# Patient Record
Sex: Female | Born: 1935 | Race: White | Hispanic: No | Marital: Married | Smoking: Never smoker
Health system: Southern US, Community
[De-identification: ages and names within clinical notes are randomized; demographics above are authoritative.]

## PROBLEM LIST (undated history)

## (undated) DIAGNOSIS — E079 Disorder of thyroid, unspecified: Secondary | ICD-10-CM

## (undated) DIAGNOSIS — I10 Essential (primary) hypertension: Secondary | ICD-10-CM

## (undated) DIAGNOSIS — K219 Gastro-esophageal reflux disease without esophagitis: Secondary | ICD-10-CM

## (undated) DIAGNOSIS — F32A Depression, unspecified: Secondary | ICD-10-CM

## (undated) DIAGNOSIS — M1990 Unspecified osteoarthritis, unspecified site: Secondary | ICD-10-CM

## (undated) DIAGNOSIS — E039 Hypothyroidism, unspecified: Secondary | ICD-10-CM

## (undated) DIAGNOSIS — I2699 Other pulmonary embolism without acute cor pulmonale: Secondary | ICD-10-CM

## (undated) DIAGNOSIS — R7303 Prediabetes: Secondary | ICD-10-CM

## (undated) DIAGNOSIS — F419 Anxiety disorder, unspecified: Secondary | ICD-10-CM

## (undated) DIAGNOSIS — H9191 Unspecified hearing loss, right ear: Secondary | ICD-10-CM

## (undated) HISTORY — PX: FOOT NEUROMA SURGERY: SHX646

## (undated) HISTORY — PX: APPENDECTOMY: SHX54

## (undated) HISTORY — PX: SQUAMOUS CELL CARCINOMA EXCISION: SHX2433

## (undated) HISTORY — PX: REPLACEMENT TOTAL KNEE: SUR1224

## (undated) HISTORY — DX: Gastro-esophageal reflux disease without esophagitis: K21.9

## (undated) HISTORY — PX: FOOT SURGERY: SHX648

## (undated) HISTORY — DX: Other pulmonary embolism without acute cor pulmonale: I26.99

## (undated) HISTORY — PX: CATARACT EXTRACTION: SUR2

---

## 1980-07-21 HISTORY — PX: FOOT NEUROMA SURGERY: SHX646

## 2001-07-21 HISTORY — PX: ROTATOR CUFF REPAIR: SHX139

## 2009-07-21 HISTORY — PX: IVC FILTER INSERTION: CATH118245

## 2009-07-21 HISTORY — PX: KNEE ARTHROSCOPY: SUR90

## 2010-09-18 ENCOUNTER — Other Ambulatory Visit (HOSPITAL_COMMUNITY): Payer: Self-pay | Admitting: Orthopaedic Surgery

## 2010-09-18 ENCOUNTER — Encounter (HOSPITAL_COMMUNITY)
Admission: RE | Admit: 2010-09-18 | Discharge: 2010-09-18 | Disposition: A | Discharge status: Home / Self Care | Payer: Medicare Other | Source: Ambulatory Visit | Attending: Orthopaedic Surgery | Admitting: Orthopaedic Surgery

## 2010-09-18 ENCOUNTER — Ambulatory Visit (HOSPITAL_COMMUNITY)
Admission: RE | Admit: 2010-09-18 | Discharge: 2010-09-18 | Disposition: A | Discharge status: Home / Self Care | Payer: Medicare Other | Source: Ambulatory Visit | Attending: Orthopaedic Surgery | Admitting: Orthopaedic Surgery

## 2010-09-18 DIAGNOSIS — Z01812 Encounter for preprocedural laboratory examination: Secondary | ICD-10-CM | POA: Insufficient documentation

## 2010-09-18 DIAGNOSIS — Z01818 Encounter for other preprocedural examination: Secondary | ICD-10-CM | POA: Insufficient documentation

## 2010-09-18 DIAGNOSIS — I10 Essential (primary) hypertension: Secondary | ICD-10-CM | POA: Insufficient documentation

## 2010-09-18 DIAGNOSIS — M1710 Unilateral primary osteoarthritis, unspecified knee: Secondary | ICD-10-CM

## 2010-09-18 DIAGNOSIS — IMO0002 Reserved for concepts with insufficient information to code with codable children: Secondary | ICD-10-CM | POA: Insufficient documentation

## 2010-09-18 LAB — APTT: aPTT: 28 seconds (ref 24–37)

## 2010-09-18 LAB — SURGICAL PCR SCREEN: MRSA, PCR: NEGATIVE

## 2010-09-18 LAB — PROTIME-INR
INR: 0.98 (ref 0.00–1.49)
Prothrombin Time: 13.2 seconds (ref 11.6–15.2)

## 2010-09-18 LAB — COMPREHENSIVE METABOLIC PANEL
AST: 35 U/L (ref 0–37)
Albumin: 4.3 g/dL (ref 3.5–5.2)
BUN: 16 mg/dL (ref 6–23)
Chloride: 100 mEq/L (ref 96–112)
Creatinine, Ser: 0.72 mg/dL (ref 0.4–1.2)
GFR calc Af Amer: >60 mL/min (ref >60)
Total Bilirubin: 0.4 mg/dL (ref 0.3–1.2)
Total Protein: 7.4 g/dL (ref 6.0–8.3)

## 2010-09-18 LAB — CBC
HCT: 36.5 % (ref 36.0–46.0)
Platelets: 335 10*3/uL (ref 150–400)
RDW: 13 % (ref 11.5–15.5)
WBC: 11.9 10*3/uL — ABNORMAL HIGH (ref 4.0–10.5)

## 2010-09-18 LAB — URINE MICROSCOPIC-ADD ON

## 2010-09-18 LAB — ABO/RH: ABO/RH(D): A POS

## 2010-09-18 LAB — DIFFERENTIAL
Basophils Absolute: 0 10*3/uL (ref 0.0–0.1)
Eosinophils Relative: 1 % (ref 0–5)
Lymphocytes Relative: 33 % (ref 12–46)

## 2010-09-18 LAB — URINALYSIS, ROUTINE W REFLEX MICROSCOPIC
Nitrite: NEGATIVE
Specific Gravity, Urine: 1.006 (ref 1.005–1.030)
Urobilinogen, UA: 0.2 mg/dL (ref 0.0–1.0)

## 2010-09-19 LAB — URINE CULTURE: Culture  Setup Time: 201202291920

## 2010-09-24 ENCOUNTER — Inpatient Hospital Stay (HOSPITAL_COMMUNITY)
Admission: RE | Admit: 2010-09-24 | Discharge: 2010-09-27 | DRG: 470 | Disposition: A | Discharge status: Skilled Nursing Facility | Payer: Medicare Other | Source: Ambulatory Visit | Attending: Orthopaedic Surgery | Admitting: Orthopaedic Surgery

## 2010-09-24 ENCOUNTER — Other Ambulatory Visit: Payer: Self-pay | Admitting: Orthopaedic Surgery

## 2010-09-24 DIAGNOSIS — Z79899 Other long term (current) drug therapy: Secondary | ICD-10-CM

## 2010-09-24 DIAGNOSIS — Z86711 Personal history of pulmonary embolism: Secondary | ICD-10-CM

## 2010-09-24 DIAGNOSIS — K219 Gastro-esophageal reflux disease without esophagitis: Secondary | ICD-10-CM | POA: Diagnosis present

## 2010-09-24 DIAGNOSIS — M1710 Unilateral primary osteoarthritis, unspecified knee: Principal | ICD-10-CM | POA: Diagnosis present

## 2010-09-24 DIAGNOSIS — E876 Hypokalemia: Secondary | ICD-10-CM | POA: Diagnosis not present

## 2010-09-24 DIAGNOSIS — Z86718 Personal history of other venous thrombosis and embolism: Secondary | ICD-10-CM

## 2010-09-24 DIAGNOSIS — E871 Hypo-osmolality and hyponatremia: Secondary | ICD-10-CM | POA: Diagnosis not present

## 2010-09-24 DIAGNOSIS — I10 Essential (primary) hypertension: Secondary | ICD-10-CM | POA: Diagnosis present

## 2010-09-24 DIAGNOSIS — D62 Acute posthemorrhagic anemia: Secondary | ICD-10-CM | POA: Diagnosis not present

## 2010-09-24 DIAGNOSIS — E039 Hypothyroidism, unspecified: Secondary | ICD-10-CM | POA: Diagnosis present

## 2010-09-24 DIAGNOSIS — Z7982 Long term (current) use of aspirin: Secondary | ICD-10-CM

## 2010-09-25 LAB — GLUCOSE, CAPILLARY: Glucose-Capillary: 116 mg/dL — ABNORMAL HIGH (ref 70–99)

## 2010-09-25 LAB — COMPREHENSIVE METABOLIC PANEL
ALT: 38 U/L — ABNORMAL HIGH (ref 0–35)
Alkaline Phosphatase: 62 U/L (ref 39–117)
Chloride: 103 mEq/L (ref 96–112)
Glucose, Bld: 112 mg/dL — ABNORMAL HIGH (ref 70–99)
Potassium: 3.6 mEq/L (ref 3.5–5.1)
Sodium: 138 mEq/L (ref 135–145)
Total Bilirubin: 0.5 mg/dL (ref 0.3–1.2)
Total Protein: 5.4 g/dL — ABNORMAL LOW (ref 6.0–8.3)

## 2010-09-25 LAB — CBC
HCT: 22.7 % — ABNORMAL LOW (ref 36.0–46.0)
Hemoglobin: 7.8 g/dL — ABNORMAL LOW (ref 12.0–15.0)
MCV: 86.6 fL (ref 78.0–100.0)
RBC: 2.62 MIL/uL — ABNORMAL LOW (ref 3.87–5.11)
RDW: 12.6 % (ref 11.5–15.5)
WBC: 15 10*3/uL — ABNORMAL HIGH (ref 4.0–10.5)

## 2010-09-26 LAB — TYPE AND SCREEN
ABO/RH(D): A POS
Antibody Screen: NEGATIVE
Unit division: 0

## 2010-09-26 LAB — CBC
HCT: 29.4 % — ABNORMAL LOW (ref 36.0–46.0)
MCH: 30.1 pg (ref 26.0–34.0)
MCV: 86.7 fL (ref 78.0–100.0)
RBC: 3.39 MIL/uL — ABNORMAL LOW (ref 3.87–5.11)
RDW: 13.8 % (ref 11.5–15.5)
WBC: 11.3 10*3/uL — ABNORMAL HIGH (ref 4.0–10.5)

## 2010-09-26 LAB — BASIC METABOLIC PANEL
BUN: 6 mg/dL (ref 6–23)
Chloride: 101 mEq/L (ref 96–112)
Glucose, Bld: 103 mg/dL — ABNORMAL HIGH (ref 70–99)
Potassium: 3.4 mEq/L — ABNORMAL LOW (ref 3.5–5.1)

## 2010-09-26 NOTE — Consult Note (Signed)
[name], [name]                  ACCOUNT NO.:  [id number]  MEDICAL RECORD NO.:  [id number]           PATIENT TYPE:  I  LOCATION:  5038                         FACILITY:  [facility]  PHYSICIAN:  Lonia Blood, M.D.      DATE OF BIRTH:  21-May-1934  DATE OF CONSULTATION:  09/24/2010 DATE OF DISCHARGE:                                CONSULTATION   Consult requested for by Dr. Cleophas Dunker.  REASON FOR CONSULTATION:  Medical management of hypertension and hypothyroidism.  BRIEF HISTORY OF PRESENT ILLNESS:  The patient is a 77 year old female with osteoarthritis, end stage in the right knee who underwent total knee replacement today.  She is currently recuperating from her surgery, but has multiple medical problems including high blood pressure, hypothyroidism, and previous DVT.  Hence, we are being consulted for further management.  The patient currently only complains of pain.  No other complaints.  Denied any nausea, vomiting, diarrhea.  No abdominal pain.  She is at the postoperative care unit at this point.  PAST MEDICAL HISTORY: 1. Hypertension. 2. Hypothyroidism. 3. History of DVT after previous knee surgery.  She also had PE at     that time. 4. She is status post Greenfield filter placement. 5. Multiple drug allergies.  PAST SURGICAL HISTORY:  The patient also has IVC filter placed in February 2011.  She has morton neuroma of the right foot in December 2006, status post vaginal sling in July 2003, rotator cuff tear of the left shoulder was repaired in October 2001.  She had right thumb surgery in December 1995, another morton neuroma in September 1989.  She had another right thumb neuroma in September 1982.  Status post appendectomy.  ALLERGIES:  She is allergic to CODEINE, PERCOCET, AZITHROMYCIN, AND COUMADIN.  Also, the patient complained of VICODIN, DARVOCET, AND DEMEROL.  CURRENT MEDICATIONS: 1. Amitriptyline 50 mg nightly. 2. Amlodipine 5 mg daily. 3. Artificial tears  each eye daily. 4. Vitamin D3 1000 mg daily. 5. Colace 100 mg b.i.d. 6. She is on Lovenox subcutaneously twice a day. 7. Fentanyl patch 100 mcg every 24 hours. 8. Ferrous sulfate 325 mg t.i.d. 9. Dilaudid PCA. 10.Toradol 50 mg IV q.6 h p.r.n. 11.Levothyroxine 100 mcg Mondays, Tuesdays, Thursdays, and Fridays and     115 mcg on Wednesdays. 12.Metoprolol 12.5 mg p.o. b.i.d. 13.Protonix 40 mg p.o. b.i.d. 14.She has vancomycin currently at 1000 mg IV q.2 h. 15.Tylenol, codeine, Norco and other p.r.n. medications.  SOCIAL HISTORY:  The patient denied tobacco, alcohol, or IV drug use.   FAMILY HISTORY:  Denied any significant family history.  She is married and retired.  In her family, the mother died at age of 75.  She did have cancer of the bile duct.  She had hypertension.  Father also died at age of 64 from Parkinsonism.  One of her brothers is still alive with cardiac disease status post CABG x5 in April 2011, also kidney disease. She has one sister, age 66, another one is 69, another is 40, all have high blood pressure and some diabetes.  Couple of them have stroke, one of  the sister had stroke at the age of 3, another one had seizures, she is 55 years now, another one died from asthma at the age of 21.  REVIEW OF SYSTEMS:  All systems are currently negative except per HPI.  PHYSICAL EXAMINATION:  VITAL SIGNS:  Temperature is 97, blood pressure 91/53, her pulse 70, respiratory rate is 16, sats 98% on 2 liters. GENERAL:  She is awake, alert, oriented, status post right knee replacement.  She is in no acute distress. HEENT:  PERRL.  EOMI.  No pallor, no jaundice.  No rhinorrhea. NECK:  Supple.  No JVD, no lymphadenopathy. RESPIRATORY:  She has good air entry bilaterally.  No wheezes, no rales, no crackles. CARDIOVASCULAR SYSTEM.  She has S1 and S2.  No audible murmur. ABDOMEN:  Soft, nontender with positive bowel sounds. EXTREMITIES:  Right lower extremity, the knee is  wrapped postoperatively, warm to touch and immobilized.  Labs are currently pending.  ASSESSMENT:  This is a 77 year old woman postoperatively with known history of hypertension, status post total knee replacement.  She had previous deep venous thrombosis as well as hypothyroidism.  RECOMMENDATIONS: 1. Continue all her home medications at this point. 2. DVT prophylaxis with Lovenox, and possibly Coumadin for at least     half a month. 3. Check basic labs, CBC, BMET, and TSH and adjust medications     accordingly. 4. Further treatment will depend on the patient's condition in the     hospital.  Thank you for the consult and we will follow with you.     Lonia Blood, M.D.     Verlin Grills  D:  09/24/2010  T:  09/25/2010  Job:  [id number]  Electronically Signed by Lonia Blood M.D. on 09/26/2010 06:40:18 AM

## 2010-09-27 LAB — CBC
HCT: 33 % — ABNORMAL LOW (ref 36.0–46.0)
Hemoglobin: 11 g/dL — ABNORMAL LOW (ref 12.0–15.0)
MCH: 28.9 pg (ref 26.0–34.0)
MCHC: 33.3 g/dL (ref 30.0–36.0)
MCV: 86.8 fL (ref 78.0–100.0)

## 2010-09-27 LAB — BASIC METABOLIC PANEL
BUN: 7 mg/dL (ref 6–23)
CO2: 28 mEq/L (ref 19–32)
Calcium: 8.7 mg/dL (ref 8.4–10.5)
Creatinine, Ser: 0.48 mg/dL (ref 0.4–1.2)
Glucose, Bld: 101 mg/dL — ABNORMAL HIGH (ref 70–99)

## 2010-10-02 NOTE — Op Note (Signed)
[name], [name]                  ACCOUNT NO.:  [id number]  MEDICAL RECORD NO.:  [id number]           PATIENT TYPE:  I  LOCATION:  5038                         FACILITY:  [facility]  PHYSICIAN:  Claude Manges. Whitfield, M.D.DATE OF BIRTH:  1934/06/20  DATE OF PROCEDURE:  09/24/2010 DATE OF DISCHARGE:                              OPERATIVE REPORT   PREOPERATIVE DIAGNOSIS:  End-stage osteoarthritis, right knee.  POSTOPERATIVE DIAGNOSIS:  End-stage osteoarthritis, right knee.  PROCEDURE:  Right total knee replacement.  SURGEON:  Claude Manges. Cleophas Dunker, MD  ASSISTANT:  Oris Drone. Petrarca, PA-C  ANESTHESIA:  General with supplemental femoral nerve block.  COMPLICATIONS:  None.  COMPONENTS:  DePuy LCS standard femoral component.  A #3 keeled tibial tray.  A 12.5-mm polyethylene bridging bearing, a 3-peg metal back rotating patella.  Components were secured with polymethyl-methacrylate without antibiotics.  PROCEDURE:  [name] was met in the holding area and marked the right knee as the appropriate operative site.  Any questions were answered. She was then transported to room #1 and placed under general orotracheal anesthesia without difficulty.  She did receive a preoperative interscalene femoral nerve block.  Nursing staff inserted a Foley catheter.  Urine was clear.  Tourniquet was then applied to the right lower extremity.  The leg was prepped with Betadine scrub and DuraPrep.  The tourniquet to the midfoot sterile draping was performed.  With the extremity elevated, Esmarch exsanguinated with a proximal tourniquet at 350 mmHg.  A midline longitudinal incision was made centered about the patella extending from the superior pouch to the tibial tubercle.  Via sharp dissection, the incision was carried down to the subcutaneous tissue. The first layer of capsule was incised in midline and medial parapatellar incision was then made to the deep capsular joint was entered.  There was  minimal clear yellow joint effusion.  Patella was everted at 180 degrees laterally.  The knee flexed to 90 degrees.  There was moderate amount of orange-tinged synovium.  Specimen was sent for Cytology.  A synovectomy was performed.  Large osteophytes were removed from the medial lateral femoral condyle and about the patella.  There was complete absence of articular cartilage laterally where there was increased valgus that I could correct to neutral.  We then templated a standard femoral component, proceeded with our initial bony cut on the proximal tibia using the external tibial guide. At each step, we checked our alignment and felt we had perfect alignment with the alignment guide through the center of the ankle.  Subsequent cuts were then made on the femur using 12.5 mm flexion/extension gaps, which were symmetrical.  MCL and LCL remained intact throughout the procedure.  Lamina spreaders were then placed on either side of the joint.  Medial and lateral menisci were removed as well as ACL and PCL. There was a small Baker cyst medially, which I debrided, any synovitis was debrided posteriorly.  ACL and PCL were also resected.  Osteophytes were removed from the femoral condyles with the curved three-quarter inch osteotome.  Distal femoral valgus cut was using a 3-degree of valgus position.  The final  cut was then made to obtain the tapered cuts on the femur and to apply the center holes for femur fixation.  Retractor was then placed about the tibia.  The tibia was advanced anteriorly.  We measured a #3 tibial tray, which seemed to be a perfect fit.  Center hole was then made followed by the keeled cut.  The tibial tray in place 12.5 mm bridging bearing was then inserted followed by the trial femur.  In full extension and through full range of motion the tibial tray remained intact.  There was no opening with varus or valgus stress.  The patella was prepared by removing 10 mm of bone  leaving 13 mm of patellar thickness.  The three-hole jig was then made and the trial patella inserted through full range of motion.  There was no subluxation.  Trial components were removed.  The joint was copiously irrigated with saline solution.  The final components were then inserted with polymethyl methacrylate without any antibiotics.  We initially inserted the tibia removing extraneous methacrylate from its periphery.  We had a very nice fit, the tibial 12.5 mm region bearing was then applied.  The standard femur was then impacted with methacrylate and extraneous methacrylate removed from its periphery.  The knee was then placed in extension.  The patella was applied with methacrylate and a patellar clamp.  At approximately 15 minutes, the methacrylate had matured and hardened. We then checked the joint.  There was no further extraneous methacrylate.  We injected 0.25% Marcaine with epinephrine into the deep capsule after checking with anesthesia for the appropriate dosage. Tourniquet was deflated at 1 hour and 11 minutes.  Any gross bleeders were Bovie coagulated.  The Hemovac drain was inserted.  The deep capsule was closed with interrupted #1 Ethibond.  Superficial capsule closed with running 0-Vicryl, subcu with 3-0 Monocryl, skin closed with skin clips.  Sterile bulky dressing was applied followed by the patient's support stocking.  The patient tolerated the procedure well without complications.     Claude Manges. Cleophas Dunker, M.D.     PWW/MEDQ  D:  09/24/2010  T:  09/24/2010  Job:  [id number]  Electronically Signed by Norlene Campbell M.D. on 10/02/2010 12:47:23 PM

## 2010-10-29 ENCOUNTER — Ambulatory Visit: Payer: Medicare Other | Attending: Orthopaedic Surgery | Admitting: Physical Therapy

## 2010-10-29 DIAGNOSIS — M25669 Stiffness of unspecified knee, not elsewhere classified: Secondary | ICD-10-CM | POA: Insufficient documentation

## 2010-10-29 DIAGNOSIS — M25569 Pain in unspecified knee: Secondary | ICD-10-CM | POA: Insufficient documentation

## 2010-10-29 DIAGNOSIS — R262 Difficulty in walking, not elsewhere classified: Secondary | ICD-10-CM | POA: Insufficient documentation

## 2010-10-29 DIAGNOSIS — IMO0001 Reserved for inherently not codable concepts without codable children: Secondary | ICD-10-CM | POA: Insufficient documentation

## 2010-10-30 ENCOUNTER — Ambulatory Visit: Payer: Medicare Other | Admitting: Physical Therapy

## 2010-11-05 ENCOUNTER — Ambulatory Visit: Payer: Medicare Other | Admitting: Rehabilitation

## 2010-11-06 NOTE — Discharge Summary (Signed)
[name], [name]                  ACCOUNT NO.:  [id number]  MEDICAL RECORD NO.:  [id number]           PATIENT TYPE:  I  LOCATION:  5038                         FACILITY:  [facility]  PHYSICIAN:  Claude Manges. Markiah Janeway, M.D.DATE OF BIRTH:  05-11-1934  DATE OF ADMISSION:  09/24/2010 DATE OF DISCHARGE:  09/27/2010                        DISCHARGE SUMMARY - REFERRING   ADMISSION DIAGNOSIS:  Osteoarthritis of the right knee.  DISCHARGE DIAGNOSES: 1. Osteoarthritis of the right knee. 2. History of deep vein thrombosis with pulmonary embolus post     arthroscopy. 3. History of Greenfield filter with one fragment of the filter in the     right lung. 4. Hypothyroidism. 5. Acute blood loss anemia. 6. Hypokalemia. 7. Hyponatremia.  HISTORY:  [name] is a 77 year old white female status post arthroscopy on August 14, 2009, at [facility] for a meniscectomy and was noted to have arthritis.  She had subsequent pulmonary embolus.  She is developing now constant, moderate to severe pain in the right knee with occasional giving way.  Her pain is aching with sharp stabbing pain. She is not able to sleep secondary to her pain.  She has pain with activities of daily living.  Radiographic end-stage OA of the right knee, worse laterally.  Indicated for right total knee arthroplasty.  HOSPITAL COURSE:  A 77 year old white female, admitted on September 24, 2010, after appropriate laboratory studies were obtained as well as 1 gram of vancomycin IV on call to the operating room.  She was taken to the operating room for a right total knee arthroplasty by Dr. Norlene Young assisted by Oris Drone. Petrarca, P.A.-C.  This involved a standard femoral component with a #3 keeled tibial tray, 12.5 polyethylene bridging bearing, three peg metal back patella.  All components secured with polymethyl methacrylate.  She tolerated the procedure well.  A Foley was placed intraoperatively.  She was continued on vancomycin 1  gram IV q.12 h. x1 dose.  The reason for the vancomycin was that she did have some type of a skin rash that her dermatologist was culturing and we are protecting her against MRSA possibilities.  She was placed on Dilaudid PCA pump.  She was placed on CPM 0-6 degrees for 68 hours per day increasing by 5-10 degrees.  PT consult for partial weightbearing 50%.  Social work was consulted for admission to [facility] in [place].  Hospitalist was consulted because of her medical problems in the past.  She was allowed out of bed to chair the following day.  Her Foley was discontinued.  She had a drop in her hemoglobin and was given 2 units of packed cells each over 3 hours with 10 mg of Lasix p.o. between units.  On the 7th, she also had weaned off her PCA and weaned off O2.  The Foley was kept until the blood was complete.  On the 8th, her IV was saline locked.  She was given 20 mEq of potassium p.o. b.i.d. on that day because of developing hypokalemia.  This resolved with the potassium.  Remainder of her hospital course was uneventful and she is being discharged today  to SNF for continued care.  LABORATORY DATA:  Admitted with a hemoglobin of 12.5, hematocrit 36.5%, white count 11,900, platelets 335,000.  Her hemoglobin dropped to 7.8 on postop day #1, given 2 units of blood.  Discharge hemoglobin 11.0, hematocrit 33.0, white count 10,700, platelets 278,000.  Protime preop 13.2, INR 0.98, and PTT was 28.  Preop sodium 135, potassium 4.0, chloride 100, CO2 26, glucose 85, BUN 16, creatinine 0.72, GFR is greater than 60, bilirubin 0.4, alk phos 66, SGOT 35, SGPT 31, total protein 7.4, albumin 4.3, calcium 9.7.  On the 8th,  her potassium was 3.7.  Discharge sodium 132, potassium 3.9, chloride 99, CO2 28, glucose 101, BUN 7, creatinine 0.48.  GFR was greater than 60.  Calcium was 8.7. Urinalysis preoperatively revealed a few bacteria, 0-2 reds, 11-20 whites, rare squamous epithelium.  Cultures of  the nasal were negative for MRSA and staph.  The urine culture of February 29 revealed 2000 colonies per milliliter of insignificant growth.  Blood type was A positive.  She received 2 units of packed cells.  RADIOGRAPHIC STUDIES:  Chest x-ray of September 18, 2010, revealed no active cardiopulmonary disease.  Poor construction with an IVC filter emboli in the right lower lobe pulmonary arterial tree.  It was felt that this may be insignificant.  We did call the vascular surgeon who placed this, a Dr. Carollee Massed in [facility], and he did state that there was no need to be concerned.  DISCHARGE INSTRUCTIONS:  Diet as tolerated.  No lifting or driving for 6 weeks.  Increase activity slowly.  May shower without the dressing once there is no drainage.  Do not wash over the wound.  If drainage remains, cover the wound with a plastic wrap and allow the showering.  TED hose for 3 weeks on the right leg, may remove at nighttime.  50% percent weightbearing was taught in physical therapy with her walker.  CPM 0-60 degrees and increase by 8 hours per day.  I am starting him for 8 hours today, increase by 5-10 degrees per day.  He is to use CPM 4-6 weeks. Please call [phone] for an appointment to be seen by [name] and her Dr. Cleophas Dunker on Wednesday October 09, 2010, that being 2 weeks postop for dressing change and removal of sutures.  She was discharged in improved condition.     Oris Drone Petrarca, P.A.-C.   ______________________________ Claude Manges. Cleophas Dunker, M.D.    BDP/MEDQ  D:  09/27/2010  T:  09/27/2010  Job:  [id number]  Electronically Signed by Jacqualine Code P.A.-C. on 10/10/2010 12:41:42 PM Electronically Signed by [name] M.D. on 11/06/2010 11:26:34 AM

## 2010-11-07 ENCOUNTER — Ambulatory Visit: Payer: Medicare Other | Admitting: Physical Therapy

## 2010-11-12 ENCOUNTER — Ambulatory Visit: Payer: Medicare Other | Admitting: Rehabilitation

## 2010-11-15 ENCOUNTER — Ambulatory Visit: Payer: Medicare Other | Admitting: Rehabilitation

## 2010-11-19 ENCOUNTER — Ambulatory Visit: Payer: Medicare Other | Attending: Orthopaedic Surgery | Admitting: Physical Therapy

## 2010-11-19 DIAGNOSIS — R262 Difficulty in walking, not elsewhere classified: Secondary | ICD-10-CM | POA: Insufficient documentation

## 2010-11-19 DIAGNOSIS — M25569 Pain in unspecified knee: Secondary | ICD-10-CM | POA: Insufficient documentation

## 2010-11-19 DIAGNOSIS — M25669 Stiffness of unspecified knee, not elsewhere classified: Secondary | ICD-10-CM | POA: Insufficient documentation

## 2010-11-19 DIAGNOSIS — IMO0001 Reserved for inherently not codable concepts without codable children: Secondary | ICD-10-CM | POA: Insufficient documentation

## 2010-11-22 ENCOUNTER — Ambulatory Visit: Payer: Medicare Other | Admitting: Physical Therapy

## 2015-01-27 ENCOUNTER — Emergency Department (HOSPITAL_BASED_OUTPATIENT_CLINIC_OR_DEPARTMENT_OTHER): Payer: Medicare Other

## 2015-01-27 ENCOUNTER — Emergency Department (HOSPITAL_BASED_OUTPATIENT_CLINIC_OR_DEPARTMENT_OTHER)
Admission: EM | Admit: 2015-01-27 | Discharge: 2015-01-27 | Disposition: A | Discharge status: Home / Self Care | Payer: Medicare Other | Attending: Emergency Medicine | Admitting: Emergency Medicine

## 2015-01-27 ENCOUNTER — Encounter (HOSPITAL_BASED_OUTPATIENT_CLINIC_OR_DEPARTMENT_OTHER): Payer: Self-pay | Admitting: Emergency Medicine

## 2015-01-27 DIAGNOSIS — S92302A Fracture of unspecified metatarsal bone(s), left foot, initial encounter for closed fracture: Secondary | ICD-10-CM

## 2015-01-27 DIAGNOSIS — S99921A Unspecified injury of right foot, initial encounter: Secondary | ICD-10-CM | POA: Diagnosis present

## 2015-01-27 DIAGNOSIS — S92351A Displaced fracture of fifth metatarsal bone, right foot, initial encounter for closed fracture: Secondary | ICD-10-CM | POA: Diagnosis not present

## 2015-01-27 DIAGNOSIS — Y92193 Bedroom in other specified residential institution as the place of occurrence of the external cause: Secondary | ICD-10-CM | POA: Diagnosis not present

## 2015-01-27 DIAGNOSIS — I10 Essential (primary) hypertension: Secondary | ICD-10-CM | POA: Diagnosis not present

## 2015-01-27 DIAGNOSIS — Z8639 Personal history of other endocrine, nutritional and metabolic disease: Secondary | ICD-10-CM | POA: Insufficient documentation

## 2015-01-27 DIAGNOSIS — W01198A Fall on same level from slipping, tripping and stumbling with subsequent striking against other object, initial encounter: Secondary | ICD-10-CM | POA: Insufficient documentation

## 2015-01-27 DIAGNOSIS — Y998 Other external cause status: Secondary | ICD-10-CM | POA: Diagnosis not present

## 2015-01-27 DIAGNOSIS — M1990 Unspecified osteoarthritis, unspecified site: Secondary | ICD-10-CM | POA: Insufficient documentation

## 2015-01-27 DIAGNOSIS — L84 Corns and callosities: Secondary | ICD-10-CM | POA: Diagnosis not present

## 2015-01-27 DIAGNOSIS — Y9389 Activity, other specified: Secondary | ICD-10-CM | POA: Diagnosis not present

## 2015-01-27 HISTORY — DX: Disorder of thyroid, unspecified: E07.9

## 2015-01-27 HISTORY — DX: Essential (primary) hypertension: I10

## 2015-01-27 HISTORY — DX: Unspecified osteoarthritis, unspecified site: M19.90

## 2015-01-27 MED ORDER — HYDROCODONE-ACETAMINOPHEN 5-325 MG PO TABS: ORAL_TABLET | ORAL | Status: DC

## 2015-01-27 NOTE — ED Notes (Signed)
PA advises to d/c cam walker order due to pt driving herself.

## 2015-01-27 NOTE — ED Notes (Signed)
Cam walker cancelled per verbal order from PA-C as patient will be driving.

## 2015-01-27 NOTE — ED Provider Notes (Signed)
Medical screening examination/treatment/procedure(s) were conducted as a shared visit with non-physician practitioner(s) and myself.  I personally evaluated the patient during the encounter.   EKG Interpretation None      Patient had a lamp on her right foot last evening. Patient with significant bruising over the fourth and fifth met tarsal. X-rays show distal fracture of the fifth metatarsal. Patient's Refill is normal. Patient able to ambulate on the foot without any significant difficulty.  Patient has a podiatrist in [facility] that she is familiar with. Could follow up there. Will treat with orthopedic a postop shoe. No other injuries.  Fredia Sorrow, MD 01/27/15 1322

## 2015-01-27 NOTE — Discharge Instructions (Signed)
See your podiatrist or orthopedist in the next week.  Rest, Ice intermittently (in the first 24-48 hours), Gentle compression with an Ace wrap, and elevate (Limb above the level of the heart)   Take up to  of ibuprofen (that is usually 2 over the counter pills)  3 times a day for 5 days. Take with food.  Take vicodin for breakthrough pain, do not drink alcohol, drive, care for children or do other critical tasks while taking vicodin. Do not take Vicodin with tramadol or gabapentin.  Please be very careful not to fall! The pain medication puts you at risk for falls. Please rest as much as possible and try to not stay alone.   Please follow with your primary care doctor in the next 2 days for a check-up. They must obtain records for further management.   Do not hesitate to return to the Emergency Department for any new, worsening or concerning symptoms.

## 2015-01-27 NOTE — ED Provider Notes (Signed)
CSN: [id number]     Arrival date & time 01/27/15  1157 History   First MD Initiated Contact with Patient 01/27/15 1212     Chief Complaint  Patient presents with  . Fall  . Foot Injury     (Consider location/radiation/quality/duration/timing/severity/associated sxs/prior Treatment) HPI   Blood pressure 149/63, pulse 83, temperature 98.3 F (36.8 C), temperature source Oral, resp. rate 18, SpO2 96 %.  [name] is a 81 y.o. female complaining of pain and bruising to right foot. Patient states that she went to turn on the light last night, though they have been everything that was on the side table by her bed fell onto the right foot. She then fell over onto the bed, there was no other injury. Patient is ambulatory but with pain. She rates her pain at 7 out of 10, exacerbated by weightbearing. Patient is taking tramadol and gabapentin for scoliosis she feels that the gabapentin may have played a role in the fall. She states that she didn't take it for 2 months, her spine surgeon told her she could have the gabapentin in between sections, she started taking it 2 days ago at her old dose of 300 mg. Patient is not anticoagulated, head trauma, cervicalgia, chest pain, abdominal pain or difficulty moving major joints.  Past Medical History  Diagnosis Date  . Hypertension   . Arthritis   . Thyroid disease    Past Surgical History  Procedure Laterality Date  . Foot surgery    . Replacement total knee     History reviewed. No pertinent family history. History  Substance Use Topics  . Smoking status: Never Smoker   . Smokeless tobacco: Not on file  . Alcohol Use: No   OB History    No data available     Review of Systems  10 systems reviewed and found to be negative, except as noted in the HPI.   Allergies  Feldene  Home Medications   Prior to Admission medications   Medication Sig Start Date End Date Taking? Authorizing Provider  HYDROcodone-acetaminophen (NORCO/VICODIN)  5-325 MG per tablet Take 1-2 tablets by mouth every 6 hours as needed for pain and/or cough. 01/27/15   Daphine Loch, PA-C   BP 149/63 mmHg  Pulse 83  Temp(Src) 98.3 F (36.8 C) (Oral)  Resp 18  SpO2 96% Physical Exam  Constitutional: She is oriented to person, place, and time. She appears well-developed and well-nourished. No distress.  HENT:  Head: Normocephalic.  Eyes: Conjunctivae and EOM are normal. Pupils are equal, round, and reactive to light.  Neck: Normal range of motion.  Cardiovascular: Normal rate, regular rhythm and intact distal pulses.   Pulmonary/Chest: Effort normal and breath sounds normal. No stridor.  Abdominal: Soft. Bowel sounds are normal.  Musculoskeletal: Normal range of motion. She exhibits tenderness.  No deformity or overlying skin changes to the right foot, patient has ecchymoses on the dorsal side at the third and fourth MTP. DP/PT pulses 2+. She is distally neurovascularly intact, no tenderness to palpation along the malleoli.  Corn to sole of right foot  Neurological: She is alert and oriented to person, place, and time.  Psychiatric: She has a normal mood and affect.  Nursing note and vitals reviewed.   ED Course  Procedures (including critical care time) Labs Review Labs Reviewed - No data to display  Imaging Review Dg Foot Complete Right  01/27/2015   CLINICAL DATA:  Fall yesterday  EXAM: RIGHT FOOT COMPLETE - 3+  VIEW  COMPARISON:  None.  FINDINGS: There is a mildly displaced fracture involving the distal diaphysis of the fifth metatarsal. Osteopenia. There is associated soft tissue swelling about the fracture site.  IMPRESSION: Acute distal fifth metatarsal fracture.   Electronically Signed   By: Jolaine Click M.D.   On: 01/27/2015 13:10     EKG Interpretation None      MDM   Final diagnoses:  Fracture of fifth metatarsal bone, left, closed, initial encounter   Filed Vitals:   01/27/15 1206  BP: 149/63  Pulse: 83  Temp: 98.3 F  (36.8 C)  TempSrc: Oral  Resp: 18  SpO2: 96%    [name] is a pleasant 81 y.o. female presenting with right foot pain after a heavy lamp fell onto her foot last night. She has some ecchymoses with no lacerations. She is neurovascularly intact. X-ray shows a distal fifth metatarsal fracture. Patient has a podiatrist that she follows with, also given her referral to orthopedist. Patient will be given a cam walker, also a postop boot to switch out with the Cam Dan Humphreys becomes heavy or uncomfortable.  I've advised patient not to take gabapentin or tramadol when she takes Vicodin. Patient verbalized her understanding. We have discussed fall precautions.  This is a shared visit with the attending physician who personally evaluated the patient and agrees with the care plan.   Evaluation does not show pathology that would require ongoing emergent intervention or inpatient treatment. Pt is hemodynamically stable and mentating appropriately. Discussed findings and plan with patient/guardian, who agrees with care plan. All questions answered. Return precautions discussed and outpatient follow up given.   New Prescriptions   HYDROCODONE-ACETAMINOPHEN (NORCO/VICODIN) 5-325 MG PER TABLET    Take 1-2 tablets by mouth every 6 hours as needed for pain and/or cough.       Wynetta Emery, PA-C 01/27/15 1341

## 2015-01-27 NOTE — ED Notes (Signed)
Pt was leaning over last night and fell onto the bed, but struck her R foot on the lamp base. R foot is swollen with purple bruising to distal dorsal area of foot. Ambulated to triage.

## 2018-04-26 ENCOUNTER — Encounter: Payer: Self-pay | Admitting: Neurology

## 2018-04-26 ENCOUNTER — Encounter

## 2018-04-26 ENCOUNTER — Ambulatory Visit (INDEPENDENT_AMBULATORY_CARE_PROVIDER_SITE_OTHER): Payer: Medicare Other | Admitting: Neurology

## 2018-04-26 ENCOUNTER — Telehealth: Payer: Self-pay | Admitting: Neurology

## 2018-04-26 VITALS — BP 119/68 | HR 74 | Ht 65.0 in | Wt 127.0 lb

## 2018-04-26 DIAGNOSIS — R2689 Other abnormalities of gait and mobility: Secondary | ICD-10-CM

## 2018-04-26 DIAGNOSIS — H9311 Tinnitus, right ear: Secondary | ICD-10-CM | POA: Diagnosis not present

## 2018-04-26 DIAGNOSIS — G3281 Cerebellar ataxia in diseases classified elsewhere: Secondary | ICD-10-CM | POA: Insufficient documentation

## 2018-04-26 DIAGNOSIS — H905 Unspecified sensorineural hearing loss: Secondary | ICD-10-CM

## 2018-04-26 DIAGNOSIS — M21371 Foot drop, right foot: Secondary | ICD-10-CM

## 2018-04-26 NOTE — Telephone Encounter (Signed)
Patient was ordered ncv/emg testing by Dr. Vickey Huger. At check-out, patient stated she would need to look at her schedule and call us back to schedule this testing.

## 2018-04-26 NOTE — Patient Instructions (Signed)
Ataxia Ataxia is a condition that results in unsteadiness when walking and standing, poor coordination of body movements, and difficulty maintaining an upright posture. It occurs due to a problem with the part of your brain that controls coordination and stability (cerebellar dysfunction). What are the causes? Ataxia can develop later in life (acquired ataxia) during your 20s to 30s, and even as late as into your 60s or beyond. Acquired ataxia may be caused by:  Changes in your nervous system (neurodegenerative).  Changes throughout your body (systemic disorders).  Excess exposure to: ? Medicines, such as phenytoin and lithium. ? Solvents. ? Abuse of alcohol (alcoholism).  Medical conditions, such as: ? Celiac sprue. ? Hypothyroidism. ? Vitamin E deficiency. ? Structural brain abnormalities, such as tumors. ? Multiple sclerosis. ? Stroke. ? Head injury.  Ataxia may also be present early in life (non-acquired ataxia). There are two main types of non-acquired ataxia:  Cerebellar dysfunction present at birth (congenital).  Family inheritance (genetic heredity). Friedreich ataxia is the most common form of hereditary ataxia.  What are the signs or symptoms? The signs and symptoms of ataxia can vary depending on how severe the condition is that causes it. Signs and symptoms may include:  Unsteadiness.  Walking with a wide stance.  Tremor.  Poorly coordinated body movements.  Difficulty maintaining a straight (upright) posture.  Fatigue.  Changes in your speech.  Changes in your vision.  Difficulty swallowing.  Difficulty with writing.  Decreased mental status (dementia).  Muscle spasms.  How is this diagnosed? Ataxia is diagnosed by discussing your personal and family history and through a physical exam. You may also have additional tests such as:  MRI.  Genetic testing.  How is this treated? Treatment for ataxia may include treating or removing the  underlying condition causing the ataxia. Surgery may be required if a structural abnormality in your brain is causing the ataxia. Otherwise, supportive treatments may be used to manage your symptoms. Follow these instructions at home: Monitor your ataxia for any changes. The following actions may help any discomfort you are experiencing:  Do not drink alcohol.  Lie down right away if you become very unsteady, dizzy, nauseated, or feel like you are going to faint. Wait until all of these feelings pass before you get up again.  Get help right away if:  Your unsteadiness suddenly worsens.  You develop severe headaches, chest pain, or abdominal pain.  You have weakness or numbness on one side of your body.  You have problems with your vision.  You feel confused.  You have difficulty speaking.  You have an irregular heartbeat or a very fast pulse. This information is not intended to replace advice given to you by your health care provider. Make sure you discuss any questions you have with your health care provider. Document Released: 02/01/2014 Document Revised: 12/13/2015 Document Reviewed: 10/07/2013 Elsevier Interactive Patient Education  2018 [employer]. Fall Prevention in the Home Falls can cause injuries. They can happen to people of all ages. There are many things you can do to make your home safe and to help prevent falls. What can I do on the outside of my home?  Regularly fix the edges of walkways and driveways and fix any cracks.  Remove anything that might make you trip as you walk through a door, such as a raised step or threshold.  Trim any bushes or trees on the path to your home.  Use bright outdoor lighting.  Clear any walking paths of  anything that might make someone trip, such as rocks or tools.  Regularly check to see if handrails are loose or broken. Make sure that both sides of any steps have handrails.  Any raised decks and porches should have guardrails  on the edges.  Have any leaves, snow, or ice cleared regularly.  Use sand or salt on walking paths during winter.  Clean up any spills in your garage right away. This includes oil or grease spills. What can I do in the bathroom?  Use night lights.  Install grab bars by the toilet and in the tub and shower. Do not use towel bars as grab bars.  Use non-skid mats or decals in the tub or shower.  If you need to sit down in the shower, use a plastic, non-slip stool.  Keep the floor dry. Clean up any water that spills on the floor as soon as it happens.  Remove soap buildup in the tub or shower regularly.  Attach bath mats securely with double-sided non-slip rug tape.  Do not have throw rugs and other things on the floor that can make you trip. What can I do in the bedroom?  Use night lights.  Make sure that you have a light by your bed that is easy to reach.  Do not use any sheets or blankets that are too big for your bed. They should not hang down onto the floor.  Have a firm chair that has side arms. You can use this for support while you get dressed.  Do not have throw rugs and other things on the floor that can make you trip. What can I do in the kitchen?  Clean up any spills right away.  Avoid walking on wet floors.  Keep items that you use a lot in easy-to-reach places.  If you need to reach something above you, use a strong step stool that has a grab bar.  Keep electrical cords out of the way.  Do not use floor polish or wax that makes floors slippery. If you must use wax, use non-skid floor wax.  Do not have throw rugs and other things on the floor that can make you trip. What can I do with my stairs?  Do not leave any items on the stairs.  Make sure that there are handrails on both sides of the stairs and use them. Fix handrails that are broken or loose. Make sure that handrails are as long as the stairways.  Check any carpeting to make sure that it is  firmly attached to the stairs. Fix any carpet that is loose or worn.  Avoid having throw rugs at the top or bottom of the stairs. If you do have throw rugs, attach them to the floor with carpet tape.  Make sure that you have a light switch at the top of the stairs and the bottom of the stairs. If you do not have them, ask someone to add them for you. What else can I do to help prevent falls?  Wear shoes that: ? Do not have high heels. ? Have rubber bottoms. ? Are comfortable and fit you well. ? Are closed at the toe. Do not wear sandals.  If you use a stepladder: ? Make sure that it is fully opened. Do not climb a closed stepladder. ? Make sure that both sides of the stepladder are locked into place. ? Ask someone to hold it for you, if possible.  Clearly mark and make sure that  you can see: ? Any grab bars or handrails. ? First and last steps. ? Where the edge of each step is.  Use tools that help you move around (mobility aids) if they are needed. These include: ? Canes. ? Walkers. ? Scooters. ? Crutches.  Turn on the lights when you go into a dark area. Replace any light bulbs as soon as they burn out.  Set up your furniture so you have a clear path. Avoid moving your furniture around.  If any of your floors are uneven, fix them.  If there are any pets around you, be aware of where they are.  Review your medicines with your doctor. Some medicines can make you feel dizzy. This can increase your chance of falling. Ask your doctor what other things that you can do to help prevent falls. This information is not intended to replace advice given to you by your health care provider. Make sure you discuss any questions you have with your health care provider. Document Released: 05/03/2009 Document Revised: 12/13/2015 Document Reviewed: 08/11/2014 Elsevier Interactive Patient Education  [employer].

## 2018-04-26 NOTE — Progress Notes (Signed)
SLEEP MEDICINE CLINIC   Provider:  Melvyn Novas, M.D.   Primary Care Physician:  Dr. Noah Charon, MD- Internal Medicine with [facility].   Referring Provider: Lenon Ahmadi* PA at [facility].    Chief Complaint  Patient presents with  . New Patient (Initial Visit)    pt alone, rm 10. pt states that she has difficulty with walking in straight line. there has been balance diffiiculty. late 2016 or early 2017 started to notice the balance difficulties. This was picked up by PT when she was working with them. pt states when she was taking medication (Gabapentin) she had 3 bad falls. She stopped taking Gabapentin late 2017. and falls have stopped -but still has a feeling of unsteadiness.     HPI:  [name] is a 84 y.o. female patient who is seen here on 04-26-2018  in a referral from Dr. Ollen Bowl.   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   Since then, she has been less able to do things on her own, management her time has been limited away from him or the home.   She has been followed by the neurosurgeons and for pain therapy was given tramadol, amitriptyline at at one time gabapentin.  She felt gabapentin made her fall and she suddenly lost her balance with no warning. She denies drowsiness. Her PT evaluation took place  in [place] [facility]- she attended aquatic [place] in [place] , associated with Dr. [place] McAmmond [facility] .  She reports this was the only swimming pool in close proximity that she could attend as a non- swimmer.   However her physical therapy evaluation was complicated because she "ran into her therapist" multiple times.  She also had to stop doing some other therapies because her husband requiring more of her attention now.  She sometimes feels  tired but she denies having significant pain dizziness vertigo.  She runs into things , drifts to the left  In her last visit with Dr. Juanetta Gosling she stated her pain was 2 out of 10.  I reviewed her medications to include amitriptyline 25 mg up to 2 times at bedtime as needed, - she had to D/C the elavil and ended up with leg cramps. No having difficulties sleeping. amlodipine 5 mg, metoprolol, Prolia,  tramadol 1 to 2 tablets by mouth every 8 hours as needed.  Zetia 10 mg.  Sleep habit ; all around her husband needs. Has to get up at night with leg cramps, 2 times  for bathroom breaks. Sleeps 5-6 hours at night.   Sleep medical history and family sleep history: History of thyroid disorder, osteoporosis and osteoarthritis, pulmonary embolism, scoliosis of the lumbar spine, anemia, hearing loss, gastroesophageal reflux disease, high cholesterol and hypertension acquired gait instability over the last 3 years.  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  Morton's neuroma last time 2006, and appendectomy in 1967.   Mother had a CVA, following CAD and MI , died of bile-duct cancer.  maternal grandfather had a stroke. Macular degeneration in 2 sisters. One brother with MD  Social history: main caretaker of her husband, married 31 years- one adult daughter, 2 granddaughters.  Non smoker, non drinker.  Caffeine : coffee in AM 2-3 cups, sodas, iced tea rarely.  Review of Systems: Out of a complete 14 system review, the patient complains of only the following symptoms, and all other reviewed systems are negative.  Balance impaired- mild nystagmus.  Neck stiffness with TMJ, whip lash - unable to do the [facility] and not doing Eppley maneuver. She has been seen  by Rehab. sport medicine, pain treatment and by PT .     Epworth Sleepiness score not  obtained  , Fatigue severity score not obtained  , depression score-   Social History   Socioeconomic History  . Marital status: Married    Spouse name: Not on file  . Number of children: Not on file  . Years of education: Not on file  . Highest education level: Not on file  Occupational History  . Not on file  Social Needs  . Financial resource strain: Not on file  . Food insecurity:    Worry: Not on file    Inability: Not on file  . Transportation needs:    Medical: Not on file    Non-medical: Not on file  Tobacco Use  . Smoking status: Never Smoker  . Smokeless tobacco: Never Used  Substance and Sexual Activity  . Alcohol use: No  . Drug use: Never  . Sexual activity: Not on file  Lifestyle  . Physical activity:    Days per week: Not on file    Minutes per session: Not on file  . Stress: Not on file  Relationships  . Social connections:    Talks on phone: Not on file    Gets together: Not on file    Attends religious service: Not on file    Active member of club or organization: Not on file    Attends meetings of clubs or organizations: Not on file    Relationship status: Not on file  . Intimate partner violence:    Fear of current or ex partner: Not on file    Emotionally abused: Not on file    Physically abused: Not on file    Forced sexual activity: Not on file  Other Topics Concern  . Not on file  Social History Narrative  . Not on file    No family history on file. yes, see above !   Past Medical History:  Diagnosis Date  . Arthritis   . Hypertension   . Thyroid disease     Past Surgical History:  Procedure Laterality Date  . FOOT SURGERY    . REPLACEMENT TOTAL KNEE      Current Outpatient Medications  Medication Sig Dispense Refill  . AMLODIPINE BESYLATE PO Take 7.5 mg by mouth.     . Calcium Carb-Cholecalciferol (CALCIUM 600+D3 PO) Take 1 tablet by mouth daily.    . carvedilol (COREG) 12.5 MG tablet     . Cyanocobalamin (VITAMIN B 12  PO) Take 500 mcg by mouth daily.    Marland Kitchen denosumab (PROLIA) 60 MG/ML SOLN injection 60 mg.    . ezetimibe (ZETIA) 10 MG tablet Take 10 mg by mouth daily.    Marland Kitchen losartan (COZAAR) 100 MG tablet TAKE ONE TABLET BY MOUTH AT BEDTIME    . Multiple Vitamins-Minerals (PRESERVISION AREDS 2 PO) Take 2 tablets by mouth 2 (two) times daily.    Marland Kitchen omeprazole (PRILOSEC) 20 MG capsule Take by mouth.    . SYNTHROID 88 MCG tablet     . traMADol (  ULTRAM) 50 MG tablet Take 1 to 2 tablets up to 3 times daily if needed for pain     No current facility-administered medications for this visit.     Allergies as of 04/26/2018 - Review Complete 04/26/2018  Allergen Reaction Noted  . Azithromycin Other (See Comments) 11/24/2013  . Gabapentin Other (See Comments) 10/17/2016  . Amoxicillin Itching 04/26/2018  . Codeine  04/26/2018  . Feldene [piroxicam]  01/27/2015  . Warfarin  11/24/2013  . Atorvastatin Itching 01/25/2014    Vitals: BP 119/68   Pulse 74   Ht 5\' 5"  (1.651 m)   Wt 127 lb (57.6 kg)   BMI 21.13 kg/m  Last Weight:  Wt Readings from Last 1 Encounters:  04/26/18 127 lb (57.6 kg)   ZOX:WRUE mass index is 21.13 kg/m.     Last Height:   Ht Readings from Last 1 Encounters:  04/26/18 5\' 5"  (1.651 m)    Physical exam:  General: The patient is awake, alert and appears not in acute distress. The patient is well groomed. Head: Normocephalic, atraumatic. Neck is supple. Mallampati 2 neck circumference:13.75". Nasal airflow patent , TMJ click on the right evident . Retrognathia is not seen.  Cardiovascular:  Regular rate and rhythm  without  murmurs or carotid bruit, and without distended neck veins. Respiratory: Lungs are clear to auscultation. Skin:  Without evidence of edema, or rash Trunk: BMI is low  The patient's posture is stooped.   Neurologic exam : The patient is awake and alert, oriented to place and time.   Attention span & concentration ability appears normal.  Speech is fluent,   without dysarthria, dysphonia or aphasia.  Mood and affect are appropriate.  Cranial nerves: Pupils are equal and briskly reactive to light. Funduscopic exam deferred. . Extraocular movements  in vertical and horizontal planes intact but there is endpoint  Nystagmus.  Visual fields by finger perimetry are intact. Hearing to finger rub intact.   Facial sensation intact to fine touch.  Facial motor strength is symmetric and tongue and uvula move midline. Shoulder shrug was asymmetrical.   Motor exam: Normal tone, muscle bulk , symmetric in all extremities. Left shoulder is lower, less ROM. Neck is stiff.   Right foot dorsiflexion is very weak, foot drop.  Sensory:  Fine touch, pinprick and vibration were tested in lower extremities and the patient feels vibration and fine touch at both ankles. Her feet feel asleep, a little pin and needle.  Marland Kitchen Proprioception tested in the upper extremities was normal. Coordination:  Rapid alternating movements in the fingers/hands was normal. Finger-to-nose maneuver  normal without evidence of ataxia, dysmetria or tremor.Gait and station: Patient walks without assistive device and is able unassisted to climb up to the exam table.she walks deliberately slow - Stance is wider based, stable Tandem gait is  Fragmented, had to be deferred. Drifts to the left - Turns with 4-5  Steps.  Deep tendon reflexes: in the  upper and lower extremities are symmetric and intact.     Assessment:  This referral did not give me a lot of access to images, labs and history- her treatment is through [facility] , orthopedist, endocrinology.   After physical and neurologic examination, review of laboratory studies,  Personal review of imaging studies, reports of other /same  Imaging studies, results of polysomnography and / or neurophysiology testing and pre-existing records as far as provided in visit., my assessment is   1) Her gait is affected by a peripheral neuropathy,  a foot drop and  also the feeling of poor balance. She is insecure while walking, trying to watch each step. She believes this started after knee replacement.  Ordered labs for PN.   2)  She reports a history of scoliosis- or spinal stenosis ? Will request additional information from Dr. Rosanna Randy / Dr Meredith Mody - I have no access to images of her cervical and lumbar spine.   3) ordered NCS and EMG with neuromuscular specialist in our practice ( Dres Letta Kocher or Anne Hahn)  , I will also confer with Dr.Athar, movement disorder specialist.   The patient was advised of the nature of the diagnosed disorder , the treatment options and the  risks for general health and wellness arising from not treating the condition.   I spent more than 50 minutes of face to face time with the patient.  Greater than 50% of time was spent in counseling and coordination of care. We have discussed the diagnosis and differential and I answered the patient's questions.    Plan:  Treatment plan and additional workup : I think she is best served within one system, [facility].   Neuropathy panel- serum protein electrophoresis, ANA with reflex, her thyroid is followed in [facility]. ENT Dr. Richardson Landry- [facility] .  NCV and EMG.      Melvyn Novas, MD 04/26/2018, 2:18 PM  Certified in Neurology by ABPN Certified in Sleep Medicine by [facility] [address]   CC Dr Ollen Bowl : patient had complete OT, PT and vestibular evaluation in [place] with [facility]- her PCP and endocrinologist are part  of that system. There is only NCV and EMG left to do. Check TSH, I have no idea which images have been obtained. She reports never having had a brain MRI, which I ordered.   I

## 2018-04-27 ENCOUNTER — Telehealth: Payer: Self-pay | Admitting: Neurology

## 2018-04-27 LAB — CBC WITH DIFFERENTIAL/PLATELET
BASOS ABS: 0.1 10*3/uL (ref 0.0–0.2)
Basos: 1 %
EOS (ABSOLUTE): 0.2 10*3/uL (ref 0.0–0.4)
Eos: 2 %
Hematocrit: 34.6 % (ref 34.0–46.6)
Hemoglobin: 11.6 g/dL (ref 11.1–15.9)
Immature Grans (Abs): 0 10*3/uL (ref 0.0–0.1)
Immature Granulocytes: 0 %
LYMPHS ABS: 2.8 10*3/uL (ref 0.7–3.1)
Lymphs: 29 %
MCH: 29.4 pg (ref 26.6–33.0)
MCHC: 33.5 g/dL (ref 31.5–35.7)
MCV: 88 fL (ref 79–97)
MONOS ABS: 1 10*3/uL — AB (ref 0.1–0.9)
Monocytes: 10 %
Neutrophils Absolute: 5.5 10*3/uL (ref 1.4–7.0)
Neutrophils: 58 %
Platelets: 351 10*3/uL (ref 150–450)
RBC: 3.94 x10E6/uL (ref 3.77–5.28)
RDW: 13.2 % (ref 12.3–15.4)
WBC: 9.5 10*3/uL (ref 3.4–10.8)

## 2018-04-27 LAB — COMPREHENSIVE METABOLIC PANEL
ALBUMIN: 4.2 g/dL (ref 3.5–4.7)
ALK PHOS: 68 IU/L (ref 39–117)
ALT: 21 IU/L (ref 0–32)
AST: 25 IU/L (ref 0–40)
Albumin/Globulin Ratio: 1.8 (ref 1.2–2.2)
BUN / CREAT RATIO: 22 (ref 12–28)
BUN: 13 mg/dL (ref 8–27)
CHLORIDE: 99 mmol/L (ref 96–106)
CO2: 23 mmol/L (ref 20–29)
Calcium: 9.5 mg/dL (ref 8.7–10.3)
Creatinine, Ser: 0.6 mg/dL (ref 0.57–1.00)
GFR calc Af Amer: 98 mL/min/{1.73_m2} (ref >59)
GFR calc non Af Amer: 85 mL/min/{1.73_m2} (ref >59)
GLOBULIN, TOTAL: 2.3 g/dL (ref 1.5–4.5)
Glucose: 80 mg/dL (ref 65–99)
Potassium: 4.2 mmol/L (ref 3.5–5.2)
SODIUM: 137 mmol/L (ref 134–144)
Total Protein: 6.5 g/dL (ref 6.0–8.5)

## 2018-04-27 NOTE — Telephone Encounter (Signed)
-----   Message from Melvyn Novas, MD sent at 04/27/2018  8:28 AM EDT ----- Normal metabolic panel- CBC diff, CBC had increase in monocytes ( minimal) no anemia, no WBC count abnormalities.  Dr Ollen Bowl.

## 2018-04-27 NOTE — Telephone Encounter (Signed)
Patient is aware of this order sent to GI. And if she hasn't had heard in the next 2-3 days to call them and I gave her their number of [phone]

## 2018-04-27 NOTE — Telephone Encounter (Signed)
Medicare/Aarp order sent to GI. They will reach out to the pt to schedule.

## 2018-04-27 NOTE — Telephone Encounter (Signed)
Called the patient and advised her that her lab work looked good and was WNL. Pt verbalized understanding. Pt had no questions at this time but was encouraged to call back if questions arise.

## 2018-05-06 ENCOUNTER — Ambulatory Visit
Admission: RE | Admit: 2018-05-06 | Discharge: 2018-05-06 | Disposition: A | Discharge status: Home / Self Care | Payer: Medicare Other | Source: Ambulatory Visit | Attending: Neurology | Admitting: Neurology

## 2018-05-06 DIAGNOSIS — G3281 Cerebellar ataxia in diseases classified elsewhere: Secondary | ICD-10-CM

## 2018-05-10 ENCOUNTER — Telehealth: Payer: Self-pay | Admitting: Neurology

## 2018-05-10 NOTE — Telephone Encounter (Signed)
-----   Message from Melvyn Novas, MD sent at 05/08/2018  3:02 PM EDT ----- No acute changes, but some progression in small vessel disease  In comparison to 2016 study- no specific cerebellar lesions, and no atrophy of the cerebellum. None of the lesions that evolved over the last 3 years was still acute.  Melvyn Novas, MD

## 2018-05-10 NOTE — Telephone Encounter (Signed)
Called the patient and advised that there was no acute changes in comparison to the MRI completed in 2016. Advised her there was some progression in the small vessel disease but nothing that was noted clinically significant. Pt verbalized understanding. Patient has more testing to complete coming up in November. Advised her that I would call her with those results once I have them. Pt verbalized understanding. Pt had no questions at this time but was encouraged to call back if questions arise.

## 2018-05-21 ENCOUNTER — Ambulatory Visit (INDEPENDENT_AMBULATORY_CARE_PROVIDER_SITE_OTHER): Payer: Medicare Other | Admitting: Neurology

## 2018-05-21 DIAGNOSIS — M21371 Foot drop, right foot: Secondary | ICD-10-CM

## 2018-05-21 DIAGNOSIS — R2689 Other abnormalities of gait and mobility: Secondary | ICD-10-CM

## 2018-05-21 DIAGNOSIS — G6289 Other specified polyneuropathies: Secondary | ICD-10-CM | POA: Diagnosis not present

## 2018-05-21 DIAGNOSIS — R202 Paresthesia of skin: Secondary | ICD-10-CM

## 2018-05-21 DIAGNOSIS — G629 Polyneuropathy, unspecified: Secondary | ICD-10-CM | POA: Insufficient documentation

## 2018-05-21 DIAGNOSIS — Z0289 Encounter for other administrative examinations: Secondary | ICD-10-CM

## 2018-05-21 NOTE — Procedures (Signed)
Full Name: [name] Gender: Female MRN #: [id number] Date of Birth: 07-Jul-2034    Visit Date: 05/21/18 11:29 Age: 84 Years 9 Months Old Examining Physician: [name], MD  Referring Physician: Dohmeier, MD History: 84 years old female, presented with chronic bilateral feet paresthesia, slow worsening gait abnormality,  On examination, right hand intrinsic muscle atrophy, deformity of multiple hands joints, mild bilateral shoulder abduction, grip weakness, status post right knee replacement, there is no proximal lower extremity muscle weakness, mild bilateral ankle dorsiflexion, toe extension/flexion weakness, right worse than left.  Length dependent sensory loss.  Brisk left knee reflexes, bilateral Babinski signs.  Wide-based, cautious unsteady gait, right foot drop,  Summary of the tests: Nerve conduction study: Bilateral sural, superficial peroneal, right median sensory responses were absent.  Right ulnar, radial sensory responses showed mildly prolonged peak latency, with mild to moderately decreased snap amplitude.  Left peroneal to EDB, tibial motor responses were absent.  Right peroneal to EDB and tibial motor responses showed significantly prolonged distal latency, with significantly decreased the C map amplitude.  Right ulnar motor response showed mildly prolonged distal latency, mild slow conduction velocity, otherwise was normal. Right median motor response showed moderately prolonged distal latency, with severely decreased the C map amplitude, with mildly slow conduction velocity.  Electromyography: Selective needle examinations were performed at left upper and bilateral lower muscles, left cervical paraspinal, bilateral lumbosacral paraspinal muscles.  There is evidence of chronic neuropathic changes involving bilateral distal left muscles, L4-5 S1 myotomes.  There is no evidence of active denervation, but there is evidence of increased insertional activity, complex  motor unit potential at right lumbosacral paraspinal muscles.  There is also evidence of chronic neuropathic changes involving left C7, C8, T1 myotomes.  There is no evidence of active process.  Conclusion: This is an abnormal study.  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  There is also evidence of chronic bilateral lumbosacral radiculopathy, mainly involving bilateral L4-5 S1 myotomes.  There is also evidence of chronic left C7, T1 cervical radiculopathy.    ------------------------------- [name], M.D. PhD  [facility] [address] [place], [place] [address] Tel: [phone] Fax: [phone]        [facility]    Nerve / Sites Muscle Latency Ref. Amplitude Ref. Rel Amp Segments Distance Velocity Ref. Area    ms ms mV mV %  cm m/s m/s mVms  R Median - APB     Wrist APB 4.7 ?4.4 0.2 ?4.0 100 Wrist - APB 7        Upper arm APB 9.4  0.2  136 Upper arm - Wrist 21 45 ?49 0.3  R Ulnar - ADM     Wrist ADM 3.6 ?3.3 7.9 ?6.0 100 Wrist - ADM 7   22.7     B.Elbow ADM 8.0  7.2  91.1 B.Elbow - Wrist 20 46 ?49 22.5     A.Elbow ADM 10.2  6.8  93.8 A.Elbow - B.Elbow 10 46 ?49 21.9         A.Elbow - Wrist      R Peroneal - EDB     Ankle EDB 7.8 ?6.5 0.2 ?2.0 100 Ankle - EDB 9   0.8     Fib head EDB 17.1  0.2  158 Fib head - Ankle 29 31 ?44 0.6     Pop fossa EDB 20.4  0.2  93.3 Pop fossa - Fib head 10 30 ?44 0.6  Pop fossa - Ankle      L Peroneal - EDB     Ankle EDB NR ?6.5 NR ?2.0 NR Ankle - EDB 9   NR     Fib head EDB NR  NR  NR Fib head - Ankle 29 NR ?44 NR         Pop fossa - Ankle      R Tibial - AH     Ankle AH 6.9 ?5.8 0.5 ?4.0 100 Ankle - AH 9   1.6     Pop fossa AH 18.2  0.3  57.1 Pop fossa - Ankle 37 33 ?41 0.8  L Tibial - AH     Ankle AH NR ?5.8 NR ?4.0 NR Ankle - AH 9   NR     Pop fossa AH NR  NR  NR Pop fossa - Ankle   ?41 NR                       SNC    Nerve / Sites Rec. Site Peak Lat Ref.  Amp Ref. Segments  Distance    ms ms V V  cm  R Radial - Anatomical snuff box (Forearm)     Forearm Wrist 3.3 ?2.9 6 ?15 Forearm - Wrist 10  R Sural - Ankle (Calf)     Calf Ankle NR ?4.4 NR ?6 Calf - Ankle 14  L Sural - Ankle (Calf)     Calf Ankle NR ?4.4 NR ?6 Calf - Ankle 14  R Superficial peroneal - Ankle     Lat leg Ankle NR ?4.4 NR ?6 Lat leg - Ankle 14  L Superficial peroneal - Ankle     Lat leg Ankle NR ?4.4 NR ?6 Lat leg - Ankle 14  R Median - Orthodromic (Dig II, Mid palm)     Dig II Wrist NR ?3.4 NR ?10 Dig II - Wrist 13  R Ulnar - Orthodromic, (Dig V, Mid palm)     Dig V Wrist 3.5 ?3.1 3 ?5 Dig V - Wrist 51                   F  Wave    Nerve F Lat Ref.   ms ms  R Tibial - AH NR ?56.0  R Ulnar - ADM 33.7 ?32.0         EMG full       EMG Summary Table    Spontaneous MUAP Recruitment  Muscle IA Fib PSW Fasc Other Amp Dur. Poly Pattern  L. Tibialis anterior Increased None None None _______ Increased Increased 1+ Reduced  L. Tibialis posterior Increased 1+ None None _______ Increased Increased 1+ Reduced  L. Gastrocnemius (Medial head) Increased None None None _______ Increased Increased 1+ Reduced  L. Vastus lateralis Increased None None None _______ Increased Increased Normal Reduced  R. Tibialis anterior Increased None None None _______ Increased Increased 1+ Reduced  R. Tibialis posterior Increased None None None _______ Increased Increased 1+ Reduced  R. Gastrocnemius (Medial head) Increased None None None _______ Increased Increased 1+ Reduced  R. Vastus lateralis Increased None None None _______ Increased Normal 1+ Reduced  L. Lumbar paraspinals (mid) Increased None None None _______ Normal Normal Normal Normal  L. Lumbar paraspinals (low) Increased None None None _______ Normal Normal Normal Normal  R. Lumbar paraspinals (mid) Increased None None None _______ Increased Increased 1+ Normal  R. Lumbar paraspinals (low) Increased None None None _______ Increased Increased  1+ Normal    L. First dorsal interosseous Normal None None None _______ Normal Normal Normal Reduced  L. Pronator teres Normal None None None _______ Normal Normal Normal Reduced  L. Extensor digitorum communis Normal None None None _______ Normal Normal Normal Reduced  L. Deltoid Normal None None None _______ Normal Normal Normal Normal  L. Biceps brachii Normal None None None _______ Normal Normal Normal Normal  L. Triceps brachii Normal None None None _______ Normal Normal Normal Reduced  L. Cervical paraspinals Normal None None None _______ Normal Normal Normal Normal

## 2018-05-24 ENCOUNTER — Telehealth: Payer: Self-pay | Admitting: Neurology

## 2018-05-24 NOTE — Telephone Encounter (Signed)
-----   Message from Melvyn Novas, MD sent at 05/24/2018  8:17 AM EST ----- Conclusion per Dr. Terrace Arabia : This is an abnormal study.  1)There is electrodiagnostic evidence of an axonal sensorimotor polyneuropathy.  2)There is also evidence of chronic bilateral lumbosacral radiculopathy, mainly involving bilateral L4-5 S1 myotomes.  3)There is also evidence of chronic left C7, T1 cervical radiculopathy. This affects her hand muscles.   This means that the patient has chronic bilateral lower back nerve root impingement as well as left neck spine. There is additionally a polyneuropathy , which can be multifactorial in origin. This also affects her gait.    Cc Dr Rikki Spearing , Pollyann Savoy  Please inqiere about PCP

## 2018-05-24 NOTE — Telephone Encounter (Signed)
Called and reviewed the NCV/EMG results with the pt. Informed the patient that Dr Vickey Huger would recommend we schedule a follow up visit which we went ahead and scheduled for 05/31/18 at 1:30 pm. Pt verbalized understanding of arriving and checking in between 1 and 1:15 pm. Also informed the patient that Dr Vickey Huger would recommend the pt try PT for balance concerns and a a medication metanx that is a combination B supplement. Patient states that she has completed some therapy in [place] but there was some concern with certain neck motions. Pt states that she recently started a B supplement over the counter but states she is not opposed to trying something new. Patient states that she would like to discuss these with Dr Dohmeier further at the follow up apt.

## 2018-05-31 ENCOUNTER — Encounter: Payer: Self-pay | Admitting: Neurology

## 2018-05-31 ENCOUNTER — Ambulatory Visit (INDEPENDENT_AMBULATORY_CARE_PROVIDER_SITE_OTHER): Payer: Medicare Other | Admitting: Neurology

## 2018-05-31 VITALS — BP 159/69 | HR 64 | Ht 65.0 in | Wt 129.0 lb

## 2018-05-31 DIAGNOSIS — M5412 Radiculopathy, cervical region: Secondary | ICD-10-CM

## 2018-05-31 DIAGNOSIS — M5116 Intervertebral disc disorders with radiculopathy, lumbar region: Secondary | ICD-10-CM | POA: Diagnosis not present

## 2018-05-31 DIAGNOSIS — G6289 Other specified polyneuropathies: Secondary | ICD-10-CM

## 2018-05-31 DIAGNOSIS — G608 Other hereditary and idiopathic neuropathies: Secondary | ICD-10-CM | POA: Diagnosis not present

## 2018-05-31 DIAGNOSIS — G629 Polyneuropathy, unspecified: Secondary | ICD-10-CM

## 2018-05-31 NOTE — Progress Notes (Signed)
SLEEP MEDICINE CLINIC   Provider:  Melvyn Novas, M.D.   Primary Care Physician:  Dr. Noah Charon, MD- Internal Medicine with [facility].   Referring Provider: Dr Ollen Bowl at [facility].    Chief Complaint  Patient presents with  . New Patient (Initial Visit)    pt alone, rm 10. pt states that she has difficulty with walking in straight line. there has been balance diffiiculty. late 2016 or early 2017 started to notice the balance difficulties. This was picked up by PT when she was working with them. pt states when she was taking medication (Gabapentin) she had 3 bad falls. She stopped taking Gabapentin late 2017. and falls have stopped -but still has a feeling of unsteadiness.     HPI:  [name] is a 84 y.o. female patient, following up on a recent EMG and NCV - and brain MRI .  Notes recorded by Melvyn Novas, MD on 05/08/2018 at 3:02 PM EDT No acute changes, but some progression in small vessel disease In comparison to 2016 study- no specific cerebellar lesions, and no atrophy of the cerebellum. None of the lesions that evolved over the last 3 years was still acute.  [name] Wendy Tanksley, MD.  EMG AND NCV- Dr Terrace Arabia  Nerve conduction study: Bilateral sural, superficial peroneal, right median sensory  responses were absent. Right ulnar, radial sensory responses  showed mildly prolonged peak latency, with mild to moderately  decreased snap amplitude.  Left peroneal to EDB, tibial motor responses were absent. Right  peroneal to EDB and tibial motor responses showed significantly  prolonged distal latency, with significantly decreased the C map  amplitude.  Right ulnar motor response showed mildly prolonged distal  latency, mild slow conduction velocity, otherwise was normal. Right median motor response showed moderately prolonged distal  latency, with severely decreased the C map amplitude, with mildly  slow conduction  velocity.  Electromyography: Selective needle examinations were performed at left upper and  bilateral lower muscles, left cervical paraspinal, bilateral  lumbosacral paraspinal muscles.  There is evidence of chronic neuropathic changes involving  bilateral distal left muscles, L4-5 S1 myotomes. There is no  evidence of active denervation, but there is evidence of  increased insertional activity, complex motor unit potential at  right lumbosacral paraspinal muscles.  There is also evidence of chronic neuropathic changes involving  left C7, C8, T1 myotomes. There is no evidence of active  process.  Conclusion: This is an abnormal study. There is electrodiagnostic evidence  of axonal sensorimotor polyneuropathy. There is also evidence of  chronic bilateral lumbosacral radiculopathy, mainly involving  bilateral L4-5 S1 myotomes. There is also evidence of chronic  left C7, T1 cervical radiculopathy. ------------------------------- [name], M.D. PhD  05-21-2018    This study indicated old, radiculopathic findings  In her lower back and left C 7 spine= Could this have  let to muscle atrophy in her hands bilaterally ?   The axonal neuropathy is affecting sensoriy and motor fibers and most likely related to thyroid disease. 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 Send to rheumatology.   [name] who was seen here on 04-26-2018  in a referral from Dr. Ollen Bowl.   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  Since then, she has been less able to do things on her own, management her time has been limited away from him or the home.  She has been followed by the neurosurgeons and for pain therapy was given tramadol, amitriptyline at at one  time gabapentin.  She felt gabapentin made her fall and she suddenly lost her balance with no warning. She denies drowsiness. Her PT evaluation took place  in [facility]- she attended aquatic therapy in [place] , associated with Dr. [place] McAmmond [facility] .  She reports this was the only swimming pool in close proximity that she could attend as a non- swimmer.   However her physical therapy evaluation was complicated because she "ran into her therapist" multiple times.  She also had to stop doing some other therapies because her husband requiring more of her attention now.  She sometimes feels tired but she denies having significant pain dizziness vertigo.  She runs into things , drifts to the left  In her last visit with Dr. Juanetta Gosling she stated her pain was 2 out of 10.  I reviewed her medications to include amitriptyline 25 mg up to 2 times at bedtime as needed, - she had to D/C the elavil and ended up with leg cramps. No having difficulties sleeping. amlodipine 5 mg, metoprolol, Prolia,  tramadol 1 to 2 tablets by mouth every 8 hours as needed.  Zetia 10 mg.  Sleep habit ; all around her husband needs. Has to get up at night with leg cramps, 2 times  for bathroom breaks. Sleeps 5-6 hours at night.   Sleep medical history and family sleep history: History of thyroid disorder, osteoporosis and osteoarthritis, pulmonary embolism, scoliosis of the lumbar spine, anemia, hearing loss, gastroesophageal reflux disease, high cholesterol and hypertension acquired gait instability over the last 3 years.  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  Morton's neuroma last time 2006, and appendectomy in 1967.   Mother had a CVA, following CAD and MI , died of bile-duct cancer.  maternal grandfather had a stroke. Macular degeneration in 2 sisters. One  brother with MD  Social history: main caretaker of her husband, married 22 years- one adult daughter, 2 granddaughters. Non smoker, non drinker.  Caffeine : coffee in AM 2-3 cups, sodas, iced tea rarely.  Review of Systems: Out of a complete 14 system review, the patient complains of only the following symptoms, and all other reviewed systems are negative.  Balance impaired- mild nystagmus.  Neck stiffness with TMJ, whip lash - unable to do the [facility] and not doing Eppley maneuver. She has been seen  by Rehab. sport medicine, pain treatment and by PT .     Epworth Sleepiness score not obtained  , Fatigue severity score not obtained  , depression score-   Social History   Socioeconomic History  . Marital status: Married    Spouse name: Not on file  . Number of children: Not on file  . Years of education: Not on file  . Highest education level: Not on file  Occupational History  . Not on file  Social Needs  . Financial resource strain: Not on file  . Food insecurity:    Worry: Not on file    Inability: Not on file  . Transportation needs:    Medical: Not on file    Non-medical: Not on  file  Tobacco Use  . Smoking status: Never Smoker  . Smokeless tobacco: Never Used  Substance and Sexual Activity  . Alcohol use: No  . Drug use: Never  . Sexual activity: Not on file  Lifestyle  . Physical activity:    Days per week: Not on file    Minutes per session: Not on file  . Stress: Not on file  Relationships  . Social connections:    Talks on phone: Not on file    Gets together: Not on file    Attends religious service: Not on file    Active member of club or organization: Not on file    Attends meetings of clubs or organizations: Not on file    Relationship status: Not on file  . Intimate partner violence:    Fear of current or ex partner: Not on file    Emotionally abused: Not on file    Physically abused: Not on file    Forced sexual activity: Not on file   Other Topics Concern  . Not on file  Social History Narrative  . Not on file    No family history on file. yes, see above !   Past Medical History:  Diagnosis Date  . Arthritis   . GERD (gastroesophageal reflux disease)   . Hypertension   . Pulmonary embolism (HCC)   . Thyroid disease     Past Surgical History:  Procedure Laterality Date  . APPENDECTOMY    . CATARACT EXTRACTION    . FOOT NEUROMA SURGERY Left 1982  . FOOT NEUROMA SURGERY Right [id number]  . FOOT SURGERY    . IVC FILTER INSERTION Right 2011  . KNEE ARTHROSCOPY Right 2011  . REPLACEMENT TOTAL KNEE    . ROTATOR CUFF REPAIR Left 2003    Current Outpatient Medications  Medication Sig Dispense Refill  . AMLODIPINE BESYLATE PO Take 7.5 mg by mouth.     . carvedilol (COREG) 12.5 MG tablet     . Cyanocobalamin (VITAMIN B 12 PO) Take 500 mcg by mouth daily.    Marland Kitchen denosumab (PROLIA) 60 MG/ML SOLN injection 60 mg.    . ezetimibe (ZETIA) 10 MG tablet Take 10 mg by mouth daily.    Marland Kitchen losartan (COZAAR) 100 MG tablet TAKE ONE TABLET BY MOUTH AT BEDTIME    . Multiple Vitamins-Minerals (PRESERVISION AREDS 2 PO) Take 2 tablets by mouth 2 (two) times daily.    Marland Kitchen omeprazole (PRILOSEC) 20 MG capsule Take by mouth.    Marland Kitchen OVER THE COUNTER MEDICATION 3 tablets daily. Bone Strength vitamin includes- vitamin D3 1000IU, Vit K 1 35 mcg, Vit K 2 45 mcg, calcium 770 mg, mg 58 mg, strontium 5 mg, silica 2 mg, and Vanadium 13 mcg (serving size 3 tablet)    . rOPINIRole (REQUIP) 0.25 MG tablet Take 0.25 mg by mouth at bedtime.    Marland Kitchen SYNTHROID 88 MCG tablet      No current facility-administered medications for this visit.     Allergies as of 05/31/2018 - Review Complete 05/31/2018  Allergen Reaction Noted  . Azithromycin Other (See Comments) 11/24/2013  . Gabapentin Other (See Comments) 10/17/2016  . Amoxicillin Itching 04/26/2018  . Codeine  04/26/2018  . Feldene [piroxicam]  01/27/2015  . Warfarin  11/24/2013  . Atorvastatin  Itching 01/25/2014    Vitals: BP (!) 159/69   Pulse 64   Ht 5\' 5"  (1.651 m)   Wt 129 lb (58.5 kg)   BMI 21.47 kg/m  Last Weight:  Wt Readings from Last 1 Encounters:  05/31/18 129 lb (58.5 kg)   RUE:AVWU mass index is 21.47 kg/m.     Last Height:   Ht Readings from Last 1 Encounters:  05/31/18 5\' 5"  (1.651 m)    Physical exam:  General: The patient is awake, alert and appears not in acute distress. The patient is well groomed. Head: Normocephalic, atraumatic. Neck is supple. Mallampati 2 neck circumference:13.75". Nasal airflow patent , TMJ click on the right evident . Retrognathia is not seen.  Cardiovascular:  Regular rate and rhythm  without  murmurs or carotid bruit, and without distended neck veins. Respiratory: Lungs are clear to auscultation. Skin:  Without evidence of edema, or rash Trunk: BMI is low - The patient's posture is stooped.   Neurologic exam : The patient is awake and alert, oriented to place and time.   Attention span & concentration ability appears normal.  Speech is fluent,  without dysarthria, dysphonia or aphasia.  Mood and affect are appropriate.  Cranial nerves: Pupils are equal and briskly reactive to light. Funduscopic exam deferred. . Extraocular movements  in vertical and horizontal planes intact but there is endpoint  Nystagmus.  Visual fields by finger perimetry are intact. Hearing to finger rub intact.   Facial sensation intact to fine touch.  Facial motor strength is symmetric and tongue and uvula move midline. Shoulder shrug was asymmetrical.   Motor exam: Normal tone, muscle bulk , symmetric in all extremities. Hand muscles with bilaterally severe atrophy and joint abnormalities.  Left shoulder is lower, less ROM. Neck is stiff.   Right foot dorsiflexion is very weak, left a little better - bilateral foot drop.  Sensory:  Fine touch, pinprick and vibration were tested in lower extremities and the patient feels vibration and fine touch at  both ankles. Her feet feel as if asleep, a little pin and needle.  Proprioception tested in the upper extremities was normal. Coordination:  Rapid alternating movements in the fingers/hands was normal. Finger-to-nose maneuver  normal without evidence of ataxia, dysmetria or tremor.Gait and station: Patient walks without assistive device and is able unassisted to climb up to the exam table.she walks deliberately slow - Stance is wider based, stable Tandem gait is  Fragmented, had to be deferred. Drifts to the left - Turns with 4-5  Steps.  Deep tendon reflexes: in the upper and lower extremities are symmetric and intact.   Assessment:  This referral did not give me a lot of access to images, labs and history- her treatment is through [facility] , orthopedist, endocrinology.   After physical and neurologic examination, review of laboratory studies,  Personal review of imaging studies, reports of other /same  Imaging studies, results of polysomnography and / or neurophysiology testing and pre-existing records as far as provided in visit., my assessment is   1) Her gait is affected by a peripheral neuropathy, a foot drop and also the feeling of poor balance. She is insecure while walking, trying to watch each step.  She believes this started after knee replacement.  I think her EMG and NCV has proven the NP presence and also the chronic radiculopathic changes. I let Dr Ollen Bowl decide how to treat.   2)  She reports a history of scoliosis- or spinal stenosis ?  images of her cervical and lumbar spine.  She has normal Brain MRI, but has very arthritic changes to both hands-   Dr Ollen Bowl, Please consider rheumatology referral.  I have  added ANA, RF and protein electrophoresis.    The patient was advised of the nature of the diagnosed disorder , the treatment options and the  risks for general health and wellness arising from not treating the condition.   I spent more than 25 minutes of face to face time  with the patient.  Greater than 50% of time was spent in counseling and coordination of care. We have discussed the diagnosis and differential and I answered the patient's questions.    Plan:  Treatment plan and additional workup : I think she is best served within one system, [facility].   Neuropathy panel- serum protein electrophoresis, ANA with reflex, will be forwarded to DR Ollen Bowl as her referring attending - her thyroid is followed in [facility]. I recommend rheumatological follow up at [facility].   ENT Dr. Richardson Landry- [facility] .  NCV and EMG here, returned to [facility] and results to Dr Ollen Bowl.  I strongly recommend rheumatological follow up.    Melvyn Novas, MD 05/31/2018, 2:01 PM  Certified in Neurology by ABPN Certified in Sleep Medicine by [facility] [address]   CC Dr. Ollen Bowl : patient had complete OT, PT and vestibular evaluation in [place] with [facility]- her PCP and endocrinologist are part of the [facility] system.     I

## 2018-05-31 NOTE — Patient Instructions (Addendum)
Neuropathic Pain Neuropathic pain is pain caused by damage to the nerves that are responsible for certain sensations in your body (sensory nerves). The pain can be caused by damage to:  The sensory nerves that send signals to your spinal cord and brain (peripheral nervous system).  The sensory nerves in your brain or spinal cord (central nervous system).  Neuropathic pain can make you more sensitive to pain. What would be a minor sensation for most people may feel very painful if you have neuropathic pain. This is usually a long-term condition that can be difficult to treat. The type of pain can differ from person to person. It may start suddenly (acute), or it may develop slowly and last for a long time (chronic). Neuropathic pain may come and go as damaged nerves heal or may stay at the same level for years. It often causes emotional distress, loss of sleep, and a lower quality of life. What are the causes? The most common cause of damage to a sensory nerve is diabetes. Many other diseases and conditions can also cause neuropathic pain. Causes of neuropathic pain can be classified as:  Toxic. Many drugs and chemicals can cause toxic damage. The most common cause of toxic neuropathic pain is damage from drug treatment for cancer (chemotherapy).  Metabolic. This type of pain can happen when a disease causes imbalances that damage nerves. Diabetes is the most common of these diseases. Vitamin B deficiency caused by long-term alcohol abuse is another common cause.  Traumatic. Any injury that cuts, crushes, or stretches a nerve can cause damage and pain. A common example is feeling pain after losing an arm or leg (phantom limb pain).  Compression-related. If a sensory nerve gets trapped or compressed for a long period of time, the blood supply to the nerve can be cut off.  Vascular. Many blood vessel diseases can cause neuropathic pain by decreasing blood supply and oxygen to nerves.  Autoimmune.  This type of pain results from diseases in which the body's defense system mistakenly attacks sensory nerves. Examples of autoimmune diseases that can cause neuropathic pain include lupus and multiple sclerosis.  Infectious. Many types of viral infections can damage sensory nerves and cause pain. Shingles infection is a common cause of this type of pain.  Inherited. Neuropathic pain can be a symptom of many diseases that are passed down through families (genetic).  What are the signs or symptoms? The main symptom is pain. Neuropathic pain is often described as:  Burning.  Shock-like.  Stinging.  Hot or cold.  Itching.  How is this diagnosed? No single test can diagnose neuropathic pain. Your health care provider will do a physical exam and ask you about your pain. You may use a pain scale to describe how bad your pain is. You may also have tests to see if you have a high sensitivity to pain and to help find the cause and location of any sensory nerve damage. These tests may include:  Imaging studies, such as: ? X-rays. ? CT scan. ? MRI.  Nerve conduction studies to test how well nerve signals travel through your sensory nerves (electrodiagnostic testing).  Stimulating your sensory nerves through electrodes on your skin and measuring the response in your spinal cord and brain (somatosensory evoked potentials).  How is this treated? Treatment for neuropathic pain may change over time. You may need to try different treatment options or a combination of treatments. Some options include:  Over-the-counter pain relievers.  Prescription medicines. Some medicines   used to treat other conditions may also help neuropathic pain. These include medicines to: ? Control seizures (anticonvulsants). ? Relieve depression (antidepressants).  Prescription-strength pain relievers (narcotics). These are usually used when other pain relievers do not help.  Transcutaneous nerve stimulation (TENS).  This uses electrical currents to block painful nerve signals. The treatment is painless.  Topical and local anesthetics. These are medicines that numb the nerves. They can be injected as a nerve block or applied to the skin.  Alternative treatments, such as: ? Acupuncture. ? Meditation. ? Massage. ? Physical therapy. ? Pain management programs. ? Counseling.  Follow these instructions at home:  Learn as much as you can about your condition.  Take medicines only as directed by your health care provider.  Work closely with all your health care providers to find what works best for you.  Have a good support system at home.  Consider joining a chronic pain support group. Contact a health care provider if:  Your pain treatments are not helping.  You are having side effects from your medicines.  You are struggling with fatigue, mood changes, depression, or anxiety. This information is not intended to replace advice given to you by your health care provider. Make sure you discuss any questions you have with your health care provider. Document Released: 04/03/2004 Document Revised: 01/25/2016 Document Reviewed: 12/15/2013 Elsevier Interactive Patient Education  [employer].   Please take a multivitamin, B complex.   Melvyn Novas, MD

## 2018-06-01 LAB — PROTEIN ELECTROPHORESIS, SERUM
A/G Ratio: 1.2 (ref 0.7–1.7)
ALPHA 2: 0.7 g/dL (ref 0.4–1.0)
Albumin ELP: 3.8 g/dL (ref 2.9–4.4)
Alpha 1: 0.3 g/dL (ref 0.0–0.4)
Beta: 1.1 g/dL (ref 0.7–1.3)
GAMMA GLOBULIN: 1.2 g/dL (ref 0.4–1.8)
Globulin, Total: 3.2 g/dL (ref 2.2–3.9)
Total Protein: 7 g/dL (ref 6.0–8.5)

## 2018-06-01 LAB — RHEUMATOID FACTOR: Rhuematoid fact SerPl-aCnc: 19.1 IU/mL — ABNORMAL HIGH (ref 0.0–13.9)

## 2018-06-01 LAB — ANA W/REFLEX IF POSITIVE: Anti Nuclear Antibody(ANA): NEGATIVE

## 2018-06-02 ENCOUNTER — Telehealth: Payer: Self-pay | Admitting: Neurology

## 2018-06-02 NOTE — Telephone Encounter (Signed)
Called the patient and advised the patient of the lab work and Dr Vickey Hugerohmeier recommends the patient see a rheumatologist. Informed the patient I have routed the results as well as the request to her PCP and Dr Ollen BowlHarkins. Pt verbalized understanding. She is mostly concerned about her balance issues that she is having but Dr Vickey Hugerohmeier is recommending that workup with rheumatologist is completed as well as with Dr Ollen BowlHarkins.

## 2018-06-02 NOTE — Telephone Encounter (Signed)
-----   Message from Melvyn Novasarmen Dohmeier, MD sent at 06/01/2018 12:40 PM EST ----- ANA negative. High RF - 19.1 / IU /Ml . Ask PCP for referral to rheumatology, or [facility] .

## 2018-09-28 ENCOUNTER — Ambulatory Visit: Payer: Medicare Other | Admitting: Pediatrics

## 2018-10-26 ENCOUNTER — Ambulatory Visit: Payer: Medicare Other | Admitting: Pediatrics

## 2018-12-07 ENCOUNTER — Ambulatory Visit (INDEPENDENT_AMBULATORY_CARE_PROVIDER_SITE_OTHER): Payer: Medicare Other | Admitting: Pediatrics

## 2018-12-07 ENCOUNTER — Other Ambulatory Visit: Payer: Self-pay

## 2018-12-07 ENCOUNTER — Encounter: Payer: Self-pay | Admitting: Pediatrics

## 2018-12-07 VITALS — BP 140/74 | HR 76 | Temp 97.9°F | Resp 16 | Ht 64.2 in | Wt 130.0 lb

## 2018-12-07 DIAGNOSIS — I10 Essential (primary) hypertension: Secondary | ICD-10-CM | POA: Insufficient documentation

## 2018-12-07 DIAGNOSIS — K219 Gastro-esophageal reflux disease without esophagitis: Secondary | ICD-10-CM

## 2018-12-07 DIAGNOSIS — L2084 Intrinsic (allergic) eczema: Secondary | ICD-10-CM | POA: Diagnosis not present

## 2018-12-07 DIAGNOSIS — M810 Age-related osteoporosis without current pathological fracture: Secondary | ICD-10-CM | POA: Insufficient documentation

## 2018-12-07 DIAGNOSIS — L500 Allergic urticaria: Secondary | ICD-10-CM | POA: Diagnosis not present

## 2018-12-07 DIAGNOSIS — J3081 Allergic rhinitis due to animal (cat) (dog) hair and dander: Secondary | ICD-10-CM

## 2018-12-07 MED ORDER — FAMOTIDINE 20 MG PO TABS: ORAL_TABLET | ORAL | 5 refills | Status: DC

## 2018-12-07 NOTE — Patient Instructions (Addendum)
Zyrtec 10 mg-take 1 tablet in the morning for itching or runny nose.  You may also take 1 tablet at night instead of hydroxyzine Famotidine 20 mg-take 1 tablet twice a day to see if it helps the itching Do foods with salicylates make you itch? Avoid large amounts of tomato Continue on the treatment plan outlined by your  dermatologist which includes hydrocortisone 2.5% cream twice a day red itchy areas Continue on your other medications You may have Prolia

## 2018-12-07 NOTE — Progress Notes (Addendum)
[address] [place] [address] Dept: [phone]  New Patient Note  Patient ID: [name], female    DOB: 1933-07-23  Age: 85 y.o. MRN: [id number] Date of Office Visit: 12/07/2018 Referring provider: Worthy Rancher, MD [address] [place], [place] [address]    Chief Complaint: Allergic Reaction  HPI [name] presents for evaluation of an itchy skin rash.  She began to have a rash in July of this year which was itchy.  She had been on Prolia for about 7 or 8 years and her last dose was in March 2019.Marland Kitchen Her blood pressure medication was switched from metoprolol to carvedilol and there was no significant change.  She was given hydroxyzine 25 mg at night and triamcinolone ointment with some improvement.  She had another Prolia injection in April 17, 2018.  She has not had any further injections of Prolia because of this rash.  The rash is itchy.  She had a biopsy of one  of these lesions which returned as inflamed keratosis.  In January she was placed back on metoprolol instead of carvedilol.  In February she was started on prednisone for 7 days and hydroxyzine was continued.  She did well on prednisone and then her symptoms got worse when she stopped prednisone.  In February she also saw a dermatologist who diagnosed her as having old women's eczema and started her on methotrexate.  In March of this year she again received prednisone 20 mg daily for 7 days and hydrocortisone 2.5% cream with some improvement.  She has osteoporosis.  She has had mild nasal congestion in the morning.  She does not have a history of previous eczema or chronic urticaria.  She has never had asthmatic symptoms.  She has gastroesophageal reflux and uses omeprazole 20 mg once a day  Review of Systems  Constitutional: Negative.   HENT:       Stuffy nose in the morning  Eyes:       Lens implants.  No glaucoma  Respiratory: Negative.   Cardiovascular:       Hypertension    Gastrointestinal:       Gastroesophageal reflux  Genitourinary: Negative.   Musculoskeletal:       Knee replacement, rotator cuff surgery.  Osteoporosis .  Surgery of her hands  Skin:       Itchy rash for 6 months.  Dermatologist diagnosed her with older age eczema in February and placed her on methotrexate.  Removal of a squamous cell carcinoma  Neurological: Negative.   Endo/Heme/Allergies:       No diabetes.  History of hyperthyroidism treated with radiation .  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     Outpatient Encounter Medications as of 12/07/2018  Medication Sig   AMLODIPINE BESYLATE PO Take 7.5 mg by mouth.    clindamycin (CLEOCIN) 300 MG capsule Take 300 mg by mouth 2 (two) times daily.   Cyanocobalamin (VITAMIN B 12 PO) Take 500 mcg by mouth daily.   denosumab (PROLIA) 60 MG/ML SOLN injection 60 mg.   folic acid (FOLVITE) 1 MG tablet Take 1 mg by mouth daily.   hydrOXYzine (ATARAX/VISTARIL) 25 MG tablet Take 25 mg by mouth 3 (three) times daily as needed.   losartan (COZAAR) 100 MG tablet TAKE ONE TABLET BY MOUTH AT BEDTIME   methotrexate (RHEUMATREX) 2.5 MG tablet Take 2.5 mg by mouth once a week. Caution:Chemotherapy. Protect from light.   metoprolol succinate (TOPROL  XL) 25 MG 24 hr tablet Take 25 mg by mouth daily.   Multiple Vitamins-Minerals (PRESERVISION AREDS 2 PO) Take 2 tablets by mouth 2 (two) times daily.   omeprazole (PRILOSEC) 20 MG capsule Take by mouth.   OVER THE COUNTER MEDICATION 3 tablets daily. Bone Strength vitamin includes- vitamin D3 1000IU, Vit K 1 35 mcg, Vit K 2 45 mcg, calcium 770 mg, mg 58 mg, strontium 5 mg, silica 2 mg, and Vanadium 13 mcg (serving size 3 tablet)   SYNTHROID 88 MCG tablet    triamcinolone cream (KENALOG) 0.5 % Apply 1 application topically 3 (three) times daily.   famotidine (PEPCID) 20 MG tablet Take 1 table twice a day to see if it helps the itching    [DISCONTINUED] carvedilol (COREG) 12.5 MG tablet    [DISCONTINUED] ezetimibe (ZETIA) 10 MG tablet Take 10 mg by mouth daily.   [DISCONTINUED] rOPINIRole (REQUIP) 0.25 MG tablet Take 0.25 mg by mouth at bedtime.   No facility-administered encounter medications on file as of 12/07/2018.      Drug Allergies:  Allergies  Allergen Reactions   Azithromycin Other (See Comments)    Other Reaction: Other reaction Other reaction(s): Other (See Comments) Other Reaction: Other reaction    Gabapentin Other (See Comments)    FALLS and makes pts body feels "weird" Patient Reports: Falls like Drop Attacks (no warning, just falls to the floor) Patient Reports: "It makes my body feel weird"    Amoxicillin Itching   Codeine    Feldene [Piroxicam]     Liver enzyme elevation   Warfarin     Other reaction(s): BRUISING   Atorvastatin Itching    Family History: [name]'s family history includes Asthma in her brother and sister..  Family history is positive for hayfever.  Family history is negative for angioedema, eczema, hives, food allergies, lupus  Social and environmental.  There are no pets in the home.  She is not exposed to cigarette smoking.  She has never smoked cigarettes in the past.  Physical Exam: BP 140/74    Pulse 76    Temp 97.9 F (36.6 C) (Tympanic)    Resp 16    Ht 5' 4.2" (1.631 m)    Wt 130 lb (59 kg)    SpO2 96%    BMI 22.18 kg/m    Physical Exam Vitals signs reviewed.  Constitutional:      Appearance: Normal appearance. She is normal weight.  HENT:     Head:     Comments: Eyes normal.  Ears normal.  Nose normal.  Pharynx normal. Neck:     Musculoskeletal: Neck supple.     Comments: No thyromegaly Cardiovascular:     Rate and Rhythm: Normal rate and regular rhythm.     Comments: S1-S2 normal no murmurs Pulmonary:     Comments: Clear to percussion and auscultation Abdominal:     General: Abdomen is flat.     Palpations: Abdomen is soft.     Tenderness: There  is no abdominal tenderness.     Comments: No hepatosplenomegaly  Lymphadenopathy:     Cervical: No cervical adenopathy.  Skin:    Comments: Had several papular erythematous area that were  Excoriated .  Mild dermographia noted  Neurological:     General: No focal deficit present.     Mental Status: She is alert and oriented to person, place, and time.  Psychiatric:        Mood and Affect: Mood normal.  Behavior: Behavior normal.        Thought Content: Thought content normal.        Judgment: Judgment normal.     Diagnostics: Allergy skin test showed mild reactivity to tomato and cat.  Other food testing was negative   Assessment  Assessment and Plan: 1. Intrinsic atopic dermatitis   2. Allergic urticaria   3. Allergic rhinitis due to animal hair and dander   4. Essential hypertension   5. Age-related osteoporosis without current pathological fracture   6. Gastroesophageal reflux disease without esophagitis     Meds ordered this encounter  Medications   famotidine (PEPCID) 20 MG tablet    Sig: Take 1 table twice a day to see if it helps the itching    Dispense:  60 tablet    Refill:  5    Patient Instructions  Zyrtec 10 mg-take 1 tablet in the morning for itching or runny nose.  You may also take 1 tablet at night instead of hydroxyzine Famotidine 20 mg-take 1 tablet twice a day to see if it helps the itching Do foods with salicylates make you itch? Avoid large amounts of tomato Continue on the treatment plan outlined by your  dermatologist which includes hydrocortisone 2.5% cream twice a day red itchy areas Continue on your other medications You may have Prolia   Return in about 2 weeks (around 12/21/2018).   Thank you for the opportunity to care for this patient.  Please do not hesitate to contact me with questions.  Tonette BihariJ. A. Reino Lybbert, M.D.  [facility] [address] [place], [place] [id number] [phone]

## 2018-12-21 ENCOUNTER — Encounter: Payer: Self-pay | Admitting: Pediatrics

## 2018-12-21 ENCOUNTER — Other Ambulatory Visit: Payer: Self-pay

## 2018-12-21 ENCOUNTER — Ambulatory Visit (INDEPENDENT_AMBULATORY_CARE_PROVIDER_SITE_OTHER): Payer: Medicare Other | Admitting: Pediatrics

## 2018-12-21 VITALS — BP 130/60 | HR 63 | Temp 97.6°F | Resp 16

## 2018-12-21 DIAGNOSIS — K219 Gastro-esophageal reflux disease without esophagitis: Secondary | ICD-10-CM

## 2018-12-21 DIAGNOSIS — L508 Other urticaria: Secondary | ICD-10-CM | POA: Diagnosis not present

## 2018-12-21 DIAGNOSIS — E038 Other specified hypothyroidism: Secondary | ICD-10-CM

## 2018-12-21 DIAGNOSIS — J3081 Allergic rhinitis due to animal (cat) (dog) hair and dander: Secondary | ICD-10-CM

## 2018-12-21 DIAGNOSIS — M810 Age-related osteoporosis without current pathological fracture: Secondary | ICD-10-CM | POA: Diagnosis not present

## 2018-12-21 NOTE — Progress Notes (Signed)
  [address] [place] [address] Dept: [phone]  FOLLOW UP NOTE  Patient ID: [name], female    DOB: 06-Aug-1933  Age: 85 y.o. MRN: [id number] Date of Office Visit: 12/21/2018  Assessment  Chief Complaint: Pruritis  HPI EVALYSE TACKITT presents for follow-up of her urticaria.  She is on Zyrtec 10 mg in the morning and hydroxyzine 25 mg at night.  Her symptoms are much improved.  She stopped taking famotidine because it made her constipated.  Foods with salicylates did not make her itch.  She avoids large amounts of tomato on the same day.  She has not used hydrocortisone 2.5% cream   Drug Allergies:  Allergies  Allergen Reactions  . Azithromycin Other (See Comments)    Other Reaction: Other reaction Other reaction(s): Other (See Comments) Other Reaction: Other reaction   . Gabapentin Other (See Comments)    FALLS and makes pts body feels "weird" Patient Reports: Falls like Drop Attacks (no warning, just falls to the floor) Patient Reports: "It makes my body feel weird"   . Amoxicillin Itching  . Codeine   . Feldene [Piroxicam]     Liver enzyme elevation  . Pepcid [Famotidine] Other (See Comments)    constipation  . Warfarin     Other reaction(s): BRUISING  . Atorvastatin Itching    Physical Exam: BP 130/60   Pulse 63   Temp 97.6 F (36.4 C) (Tympanic)   Resp 16   SpO2 98%    Physical Exam Constitutional:      Appearance: Normal appearance. She is normal weight.  HENT:     Head:     Comments: Eyes normal.  Ears normal.  Nose normal.  Pharynx normal. Neck:     Musculoskeletal: Neck supple.  Cardiovascular:     Rate and Rhythm: Normal rate and regular rhythm.     Comments: S1-S2 normal no murmurs Pulmonary:     Comments: Clear to percussion and auscultation Lymphadenopathy:     Cervical: No cervical adenopathy.  Skin:    Comments: Clear  Neurological:     General: No focal deficit present.     Mental Status: She is alert and oriented to  person, place, and time.  Psychiatric:        Mood and Affect: Mood normal.        Behavior: Behavior normal.        Thought Content: Thought content normal.        Judgment: Judgment normal.     Diagnostics:    Assessment and Plan: 1. Chronic urticaria   2. Other specified hypothyroidism   3. Age-related osteoporosis without current pathological fracture   4. Gastroesophageal reflux disease without esophagitis   5. Allergic rhinitis due to animal hair and dander     No orders of the defined types were placed in this encounter.   Patient Instructions  Zyrtec 10 mg in the morning for itching or runny nose Hydroxyzine 25 mg at night for itching Avoid large amounts of tomato Continue on your other medications Call us if you are not doing well on this treatment plan She may have Prolia   Return in about 6 months (around 06/22/2019).    Thank you for the opportunity to care for this patient.  Please do not hesitate to contact me with questions.  Tonette Bihari, M.D.  [facility] [address] [place], [place] [address] [phone]

## 2018-12-21 NOTE — Patient Instructions (Addendum)
Zyrtec 10 mg in the morning for itching or runny nose Hydroxyzine 25 mg at night for itching Avoid large amounts of tomato Continue on your other medications Call us if you are not doing well on this treatment plan She may have Prolia

## 2019-06-28 ENCOUNTER — Ambulatory Visit: Payer: Medicare Other | Admitting: Pediatrics

## 2019-08-19 ENCOUNTER — Ambulatory Visit: Payer: Medicare Other

## 2019-08-27 ENCOUNTER — Ambulatory Visit: Payer: Medicare Other | Attending: Internal Medicine

## 2019-08-27 DIAGNOSIS — Z23 Encounter for immunization: Secondary | ICD-10-CM

## 2019-08-27 NOTE — Progress Notes (Signed)
   Covid-19 Vaccination Clinic  Name:  [name]    MRN: [id number] DOB: 09-Aug-1933  08/27/2019  [name] was observed post Covid-19 immunization for 15 minutes without incidence. She was provided with Vaccine Information Sheet and instruction to access the V-Safe system.   [name] was instructed to call 911 with any severe reactions post vaccine: Marland Kitchen Difficulty breathing  . Swelling of your face and throat  . A fast heartbeat  . A bad rash all over your body  . Dizziness and weakness    Immunizations Administered    Name Date Dose VIS Date Route   Pfizer COVID-19 Vaccine 08/27/2019  5:18 PM 0.3 mL 07/01/2019 Intramuscular   Manufacturer: [employer], [employer]   Lot: [id number]   NDC: [address]-4

## 2019-09-21 ENCOUNTER — Ambulatory Visit: Payer: Medicare Other | Attending: Internal Medicine

## 2019-09-21 DIAGNOSIS — Z23 Encounter for immunization: Secondary | ICD-10-CM

## 2019-09-21 NOTE — Progress Notes (Signed)
   Covid-19 Vaccination Clinic  Name:  [name]    MRN: [id number] DOB: April 26, 1934  09/21/2019  [name] was observed post Covid-19 immunization for 15 minutes without incident. She was provided with Vaccine Information Sheet and instruction to access the V-Safe system.   [name] was instructed to call 911 with any severe reactions post vaccine: Marland Kitchen Difficulty breathing  . Swelling of face and throat  . A fast heartbeat  . A bad rash all over body  . Dizziness and weakness   Immunizations Administered    Name Date Dose VIS Date Route   Pfizer COVID-19 Vaccine 09/21/2019  1:44 PM 0.3 mL 07/01/2019 Intramuscular   Manufacturer: [employer], [employer]   Lot: [id number]   NDC: [address]-5

## 2020-10-21 ENCOUNTER — Emergency Department (HOSPITAL_BASED_OUTPATIENT_CLINIC_OR_DEPARTMENT_OTHER)
Admission: EM | Admit: 2020-10-21 | Discharge: 2020-10-21 | Disposition: A | Discharge status: Home / Self Care | Payer: Medicare Other | Attending: Emergency Medicine | Admitting: Emergency Medicine

## 2020-10-21 ENCOUNTER — Encounter (HOSPITAL_BASED_OUTPATIENT_CLINIC_OR_DEPARTMENT_OTHER): Payer: Self-pay | Admitting: Emergency Medicine

## 2020-10-21 ENCOUNTER — Emergency Department (HOSPITAL_BASED_OUTPATIENT_CLINIC_OR_DEPARTMENT_OTHER): Payer: Medicare Other

## 2020-10-21 ENCOUNTER — Other Ambulatory Visit: Payer: Self-pay

## 2020-10-21 DIAGNOSIS — Y93H2 Activity, gardening and landscaping: Secondary | ICD-10-CM | POA: Diagnosis not present

## 2020-10-21 DIAGNOSIS — I10 Essential (primary) hypertension: Secondary | ICD-10-CM | POA: Insufficient documentation

## 2020-10-21 DIAGNOSIS — X501XXA Overexertion from prolonged static or awkward postures, initial encounter: Secondary | ICD-10-CM | POA: Insufficient documentation

## 2020-10-21 DIAGNOSIS — Z79899 Other long term (current) drug therapy: Secondary | ICD-10-CM | POA: Diagnosis not present

## 2020-10-21 DIAGNOSIS — M542 Cervicalgia: Secondary | ICD-10-CM | POA: Insufficient documentation

## 2020-10-21 DIAGNOSIS — Y92007 Garden or yard of unspecified non-institutional (private) residence as the place of occurrence of the external cause: Secondary | ICD-10-CM | POA: Diagnosis not present

## 2020-10-21 DIAGNOSIS — G8929 Other chronic pain: Secondary | ICD-10-CM | POA: Diagnosis not present

## 2020-10-21 DIAGNOSIS — R519 Headache, unspecified: Secondary | ICD-10-CM | POA: Insufficient documentation

## 2020-10-21 MED ORDER — MORPHINE SULFATE (PF) 2 MG/ML IV SOLN
2.0000 mg | Freq: Once | INTRAVENOUS | Status: DC
  Filled 2020-10-21: qty 1

## 2020-10-21 MED ORDER — SODIUM CHLORIDE 0.9 % IV BOLUS
500.0000 mL | Freq: Once | INTRAVENOUS | Status: AC
  Administered 2020-10-21: 500 mL via INTRAVENOUS

## 2020-10-21 MED ORDER — MORPHINE SULFATE (PF) 2 MG/ML IV SOLN
2.0000 mg | Freq: Once | INTRAVENOUS | Status: DC
Start: 2020-10-21 — End: 2020-10-21

## 2020-10-21 MED ORDER — METHYLPREDNISOLONE 4 MG PO TBPK: ORAL_TABLET | ORAL | 0 refills | Status: DC

## 2020-10-21 MED ORDER — METHOCARBAMOL 500 MG PO TABS: 500.0000 mg | ORAL_TABLET | Freq: Two times a day (BID) | ORAL | 0 refills | Status: DC | PRN

## 2020-10-21 MED ORDER — DEXAMETHASONE SODIUM PHOSPHATE 4 MG/ML IJ SOLN
4.0000 mg | Freq: Once | INTRAMUSCULAR | Status: AC
  Administered 2020-10-21: 4 mg via INTRAVENOUS
  Filled 2020-10-21: qty 1

## 2020-10-21 MED ORDER — CYCLOBENZAPRINE HCL 10 MG PO TABS
10.0000 mg | ORAL_TABLET | Freq: Once | ORAL | Status: AC
  Administered 2020-10-21: 10 mg via ORAL
  Filled 2020-10-21: qty 1

## 2020-10-21 MED ORDER — ONDANSETRON 4 MG PO TBDP
4.0000 mg | ORAL_TABLET | Freq: Once | ORAL | Status: AC
Start: 2020-10-21 — End: 2020-10-21
  Administered 2020-10-21: 4 mg via ORAL
  Filled 2020-10-21: qty 1

## 2020-10-21 MED ORDER — METHOCARBAMOL 500 MG PO TABS: 500.0000 mg | ORAL_TABLET | Freq: Three times a day (TID) | ORAL | 0 refills | Status: DC | PRN

## 2020-10-21 MED ORDER — MORPHINE SULFATE (PF) 2 MG/ML IV SOLN
2.0000 mg | Freq: Once | INTRAVENOUS | Status: AC
  Administered 2020-10-21: 2 mg via INTRAMUSCULAR
  Filled 2020-10-21: qty 1

## 2020-10-21 MED ORDER — METOCLOPRAMIDE HCL 5 MG/ML IJ SOLN
10.0000 mg | Freq: Once | INTRAMUSCULAR | Status: AC
  Administered 2020-10-21: 10 mg via INTRAVENOUS
  Filled 2020-10-21: qty 2

## 2020-10-21 NOTE — Discharge Instructions (Signed)
You have been seen here for neck pain.  I have given you a prescription for steroids please take as prescribed, [facility] [facility] for a muscle relaxer please take as prescribed.  You may also take over-the-counter pain medications like ibuprofen and/or Tylenol every 6 hours as needed.  Please follow dosage and on the back of bottle.  I also recommend applying heat to the area and stretching out the muscles as this will help decrease stiffness and pain.  I have given you information on exercises please follow.  Given you the contact information for a neurosurgeon please call for further evaluation.  Come back to the emergency department if you develop chest pain, shortness of breath, severe abdominal pain, uncontrolled nausea, vomiting, diarrhea.

## 2020-10-21 NOTE — ED Notes (Signed)
ED Provider at bedside. 

## 2020-10-21 NOTE — ED Notes (Signed)
Pt d/c home with her daughter. D/c instructions and Rx reviewed. Pt and family member verbalize understanding

## 2020-10-21 NOTE — ED Triage Notes (Signed)
Reports neck pain that started last night after working with her plants.  Also endorses feeling nauseated.  Hurts to move her neck.

## 2020-10-21 NOTE — ED Notes (Signed)
Pt reports headache from her neck hurting, no changes in vision. Denies any dizziness,but endorses intermittent nausea. Melodye Ped

## 2020-10-21 NOTE — ED Notes (Signed)
Patient transported to CT 

## 2020-10-21 NOTE — ED Notes (Signed)
Pt c/o continued pain and headache. Provider made aware and will re-eval prior to giving ordered morphine

## 2020-10-21 NOTE — ED Provider Notes (Signed)
[facility] EMERGENCY DEPARTMENT Provider Note   CSN: [id number] Arrival date & time: 10/21/20  1723     History Chief Complaint  Patient presents with  . Neck Pain    [name] is a 87 y.o. female.  HPI   Patient with significant medical history of arthritis, DDD, chronic neck and back pain, hypertension presents with chief complaint of neck pain.  She endorses this started yesterday after she was working in the garden and has progressively gotten worse.  she endorses pain along her left and right side of her neck, worsening with neck movement, associated head pain.  She endorses the head pain  is from the neck pain.  She denies paresthesia or weakness in the upper or lower extremities.,  No change in vision, denies recent trauma to the area.  Patient endorses that she took her tramadol as well as a Goody powder without any relief, she denies alleviating factors.  Patient denies headaches, fevers, chills, chest pain, shortness of breath, abdominal pain, nausea, vomiting, diarrhea, worsening pedal edema.  Past Medical History:  Diagnosis Date  . Arthritis   . GERD (gastroesophageal reflux disease)   . Hypertension   . Pulmonary embolism (HCC)   . Thyroid disease     Patient Active Problem List   Diagnosis Date Noted  . Other specified hypothyroidism 12/21/2018  . Chronic urticaria 12/21/2018  . Intrinsic atopic dermatitis 12/07/2018  . Allergic urticaria 12/07/2018  . Gastroesophageal reflux disease without esophagitis 12/07/2018  . Age-related osteoporosis without current pathological fracture 12/07/2018  . Essential hypertension 12/07/2018  . Allergic rhinitis due to animal hair and dander 12/07/2018  . Radiculopathy due to lumbar intervertebral disc disorder 05/31/2018  . Radiculopathy of cervical region 05/31/2018  . Peripheral neuropathy 05/21/2018  . Multifactorial gait disorder 04/26/2018  . Right foot drop 04/26/2018  . Cerebellar ataxia in diseases  classified elsewhere (HCC) 04/26/2018  . Sensorineural hearing loss (SNHL) of right ear 04/26/2018  . Tinnitus aurium, right 04/26/2018    Past Surgical History:  Procedure Laterality Date  . APPENDECTOMY    . CATARACT EXTRACTION    . FOOT NEUROMA SURGERY Left 1982  . FOOT NEUROMA SURGERY Right [id number]  . FOOT SURGERY    . IVC FILTER INSERTION Right 2011  . KNEE ARTHROSCOPY Right 2011  . REPLACEMENT TOTAL KNEE    . ROTATOR CUFF REPAIR Left 2003     OB History   No obstetric history on file.     Family History  Problem Relation Age of Onset  . Asthma Sister   . Asthma Brother   . Allergic rhinitis Neg Hx   . Angioedema Neg Hx   . Eczema Neg Hx   . Immunodeficiency Neg Hx   . Urticaria Neg Hx     Social History   Tobacco Use  . Smoking status: Never Smoker  . Smokeless tobacco: Never Used  Vaping Use  . Vaping Use: Never used  Substance Use Topics  . Alcohol use: No  . Drug use: Never    Home Medications Prior to Admission medications   Medication Sig Start Date End Date Taking? Authorizing Provider  AMLODIPINE BESYLATE PO Take 7.5 mg by mouth.  08/30/13  Yes [provider]  Cyanocobalamin (VITAMIN B 12 PO) Take 500 mcg by mouth daily.   Yes [provider]  denosumab (PROLIA) 60 MG/ML SOLN injection 60 mg. 06/09/12  Yes [provider]  losartan (COZAAR) 100 MG tablet TAKE ONE TABLET BY  MOUTH AT BEDTIME 04/22/18  Yes [provider]  methotrexate (RHEUMATREX) 2.5 MG tablet Take 2.5 mg by mouth once a week. Caution:Chemotherapy. Protect from light.   Yes [provider]  methylPREDNISolone (MEDROL DOSEPAK) 4 MG TBPK tablet Use as directed 10/21/20  Yes Carroll Sage, PA-C  metoprolol succinate (TOPROL-XL) 25 MG 24 hr tablet Take 25 mg by mouth daily.   Yes [provider]  Multiple Vitamins-Minerals (PRESERVISION AREDS 2 PO) Take 2 tablets by mouth 2 (two) times daily.   Yes [provider]   omeprazole (PRILOSEC) 20 MG capsule Take by mouth.   Yes [provider]  OVER THE COUNTER MEDICATION 3 tablets daily. Bone Strength vitamin includes- vitamin D3 1000IU, Vit K 1 35 mcg, Vit K 2 45 mcg, calcium 770 mg, mg 58 mg, strontium 5 mg, silica 2 mg, and Vanadium 13 mcg (serving size 3 tablet)   Yes [provider]  SYNTHROID 88 MCG tablet  02/19/18  Yes [provider]  triamcinolone cream (KENALOG) 0.5 % Apply 1 application topically 3 (three) times daily.   Yes [provider]  clindamycin (CLEOCIN) 300 MG capsule Take 300 mg by mouth 2 (two) times daily.    [provider]  famotidine (PEPCID) 20 MG tablet Take 1 table twice a day to see if it helps the itching Patient not taking: No sig reported 12/07/18   Fletcher Anon, MD  folic acid (FOLVITE) 1 MG tablet Take 1 mg by mouth daily.    [provider]  hydrOXYzine (ATARAX/VISTARIL) 25 MG tablet Take 25 mg by mouth 3 (three) times daily as needed.    [provider]  methocarbamol (ROBAXIN) 500 MG tablet Take 1 tablet (500 mg total) by mouth 2 (two) times daily as needed for muscle spasms. 10/21/20   Carroll Sage, PA-C    Allergies    Azithromycin, Gabapentin, Amoxicillin, Codeine, Feldene [piroxicam], Pepcid [famotidine], Warfarin, and Atorvastatin  Review of Systems   Review of Systems  Constitutional: Negative for chills and fever.  HENT: Negative for congestion and sore throat.   Eyes: Negative for visual disturbance.  Respiratory: Negative for shortness of breath.   Cardiovascular: Negative for chest pain.  Gastrointestinal: Negative for abdominal pain, diarrhea, nausea and vomiting.  Genitourinary: Negative for enuresis.  Musculoskeletal: Positive for neck pain. Negative for back pain.  Skin: Negative for rash.  Neurological: Negative for dizziness.  Hematological: Does not bruise/bleed easily.    Physical Exam Updated Vital Signs BP (!) 162/98 (BP  Location: Right Arm)   Pulse 95   Temp 98.9 F (37.2 C) (Oral)   Resp 18   Ht 5\' 6"  (1.676 m)   Wt 56.7 kg   SpO2 96%   BMI 20.18 kg/m   Physical Exam Vitals and nursing note reviewed.  Constitutional:      General: She is not in acute distress.    Appearance: She is not ill-appearing.     Comments: Patient appeared to be mildly uncomfortable  HENT:     Head: Normocephalic and atraumatic.     Nose: No congestion.  Eyes:     Extraocular Movements: Extraocular movements intact.     Conjunctiva/sclera: Conjunctivae normal.     Pupils: Pupils are equal, round, and reactive to light.  Cardiovascular:     Rate and Rhythm: Normal rate and regular rhythm.     Pulses: Normal pulses.     Heart sounds: No murmur heard. No friction rub. No gallop.  Pulmonary:     Effort: No respiratory distress.     Breath sounds: No wheezing, rhonchi or rales.  Abdominal:     Palpations: Abdomen is soft.     Tenderness: There is no abdominal tenderness.  Musculoskeletal:     Cervical back: Rigidity and tenderness present.     Comments: Patient spine was palpated and she had notable cervical tenderness along c4-5 as well as pain within the surrounding musculature.  She had no other tenderness along her spine, no step-off deformities present.  Patient moving all 4 extremities  Skin:    General: Skin is warm and dry.  Neurological:     Mental Status: She is alert.     GCS: GCS eye subscore is 4. GCS verbal subscore is 5. GCS motor subscore is 6.     Motor: No weakness.     Coordination: Romberg sign negative.     Comments: Cranial nerves II through XII are grossly intact  Patient have no difficulty with word finding.  Psychiatric:        Mood and Affect: Mood normal.     ED Results / Procedures / Treatments   Labs (all labs ordered are listed, but only abnormal results are displayed) Labs Reviewed - No data to display  EKG None  Radiology CT Cervical Spine Wo Contrast  Result  Date: 10/21/2020 CLINICAL DATA:  Neck pain. EXAM: CT CERVICAL SPINE WITHOUT CONTRAST TECHNIQUE: Multidetector CT imaging of the cervical spine was performed without intravenous contrast. Multiplanar CT image reconstructions were also generated. COMPARISON:  Cervical radiographs September 20, 2014. FINDINGS: Alignment: There is marked (approximately 6 mm) of anterolisthesis of C4 on C5, which is slightly progressed in comparison to radiographs from September 20, 2014. Skull base and vertebrae: No evidence of acute fracture. Vertebral body heights are maintained. Soft tissues and spinal canal: No prevertebral fluid or swelling. No visible canal hematoma. Disc levels: Moderate to advanced multilevel degenerative disc disease with disc height loss, endplate sclerosis and cystic change. Multilevel facet arthropathy, severe at C3-C4 and C4-C5. Suspected canal and bilateral foraminal stenosis at C4-C5 due to the anterolisthesis and degenerative change. Upper chest: Visualized lung apices are clear. IMPRESSION: 1. No evidence of acute fracture. 2. Marked (approximately 6 mm) of anterolisthesis of C4 on C5, which is likely degenerative given severe facet arthropathy at this level and anterolisthesis on the remote prior from 2016 (although the degree of anterolisthesis likely has progressed some since then). Suspected resulting canal and foraminal stenosis at this level that is poorly evaluated by CT. An MRI of the cervical spine could further evaluate the canal/cord/foramina if clinically indicated. Electronically Signed   By: Feliberto HartsFrederick S Jones MD   On: 10/21/2020 19:46    Procedures Procedures   Medications Ordered in ED Medications  morphine 2 MG/ML injection 2 mg (2 mg Intramuscular Given 10/21/20 1831)  ondansetron (ZOFRAN-ODT) disintegrating tablet 4 mg (4 mg Oral Given 10/21/20 1840)  cyclobenzaprine (FLEXERIL) tablet 10 mg (10 mg Oral Given 10/21/20 2043)  metoCLOPramide (REGLAN) injection 10 mg (10 mg Intravenous Given  10/21/20 2054)  dexamethasone (DECADRON) injection 4 mg (4 mg Intravenous Given 10/21/20 2051)  sodium chloride 0.9 % bolus 500 mL (0 mLs Intravenous Stopped 10/21/20 2139)    ED Course  I have reviewed the triage vital signs and the nursing notes.  Pertinent labs & imaging results that were available during my care of the patient were reviewed by me and considered in my medical decision making (see chart for  details).    MDM Rules/Calculators/A&P                         Initial impression-patient presents with neck pain x1 day.  She is alert, appear to be mildly uncomfortable, vital signs reassuring.  Suspect patient suffering from muscular strain but cannot rule out compression fracture will obtain CT imaging further evaluation, provide patient with pain medications and antiemetics and reassess  Work-up-CT neck negative for acute findings  Reassessment patient was reassessed at providing her with 2 mg of morphine, states her pain has much improved, she still has slight pain in her neck, will provide her with another 2 mg of morphine.  Patient was reassessed, endorses migraine-like symptoms, will provide her with a migraine cocktail and reassess.  Patient was reassessed states she is feeling better, has no other complaints this time.  Vital signs remained stable, patient is agreeable for discharge at this time.   Rule out-low suspicion for intracranial head bleed or mass as there is no neuro deficits on my exam.  Low suspicion for spinal cord abnormality or spinal fracture as there is no deformities on my exam, patient is moving all 4 extremities, CT imaging is negative for acute findings.  Plan-I suspect patient's neck pain is secondary due to muscular strain with chronic DDD of the cervical spine.  Will provide patient with muscle relaxants, steroids, over-the-counter NSAIDs have her follow-up with neurosurgery for further evaluation.  Vital signs have remained stable, no indication for  hospital admission.  Patient discussed with attending and they agreed with assessment and plan.  Patient given at home care as well strict return precautions.  Patient verbalized that they understood agreed to said plan.    Final Clinical Impression(s) / ED Diagnoses Final diagnoses:  Neck pain    Rx / DC Orders ED Discharge Orders         Ordered    methylPREDNISolone (MEDROL DOSEPAK) 4 MG TBPK tablet        10/21/20 2153    methocarbamol (ROBAXIN) 500 MG tablet  Every 8 hours PRN,   Status:  Discontinued        10/21/20 2153    methocarbamol (ROBAXIN) 500 MG tablet  2 times daily PRN        10/21/20 2153           Carroll Sage, PA-C 10/21/20 2210    Arby Barrette, MD 10/31/20 0730

## 2020-10-21 NOTE — ED Provider Notes (Incomplete)
I provided a substantive portion of the care of this patient.  I personally performed the entirety of the {Shared Choices:25152} for this encounter. {Remember to document shared critical care using "edcritical" dot phrase:1}    

## 2021-04-18 ENCOUNTER — Telehealth: Payer: Self-pay | Admitting: Neurology

## 2021-04-18 NOTE — Telephone Encounter (Signed)
Pt's daughter has scheduled a f/u for pt but would really like to be called by RN earlier if an appointment becomes available before January.  Pt is on wait list

## 2021-04-18 NOTE — Telephone Encounter (Signed)
Placed pt on Dr. Oliva Bustard wait list. We will reach out if there are any sooner appt/cx.

## 2021-04-22 NOTE — Telephone Encounter (Signed)
Called the patient's daughter to offer opening for Tuesday 10/4 at 1:30 pm for the pt. She will call the pt to see if that works for her and will call back to confirm for sure. Advised I have placed the spot on hold.

## 2021-04-22 NOTE — Telephone Encounter (Signed)
Called the daughter back to address if the pt would be taking the apt on hold for tomorrow. No answer. LVM asking for a call back to address whether she will be taking the 1:30 for tomorrow. If not we need to be able to offer to someone else.

## 2021-04-22 NOTE — Telephone Encounter (Signed)
FYI- Pt's daughter called back stating her mother cannot make the appt tomorrow.

## 2021-05-14 IMAGING — CT CT CERVICAL SPINE W/O CM
3 of 4 series · 12 of 33 positions shown, 14 images · non-contrast
Comparison: Cervical radiographs September 20, 2014.

CLINICAL DATA: Neck pain.

EXAM:
CT CERVICAL SPINE WITHOUT CONTRAST
TECHNIQUE: Multidetector CT imaging of the cervical spine was performed without
intravenous contrast. Multiplanar CT image reconstructions were also
generated.

[Series 5: sagittal bone · sagittal · 0.26mm/px · 5 of 65 slices shown, 6 images]
[im 22/65  bone]
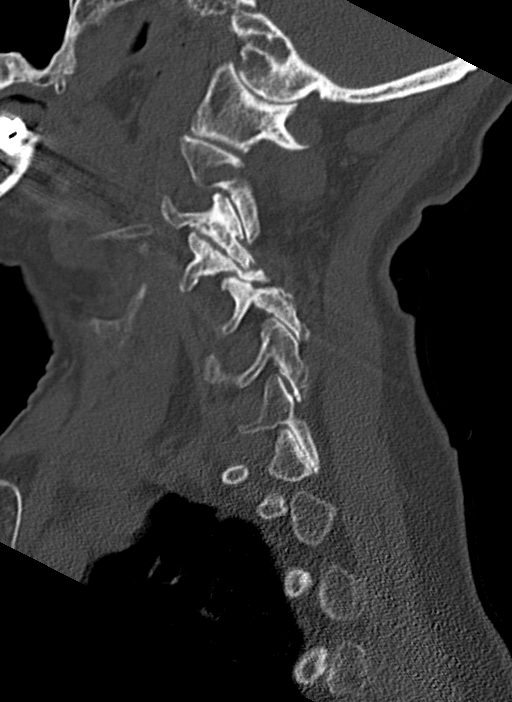
[im 27/65  bone]
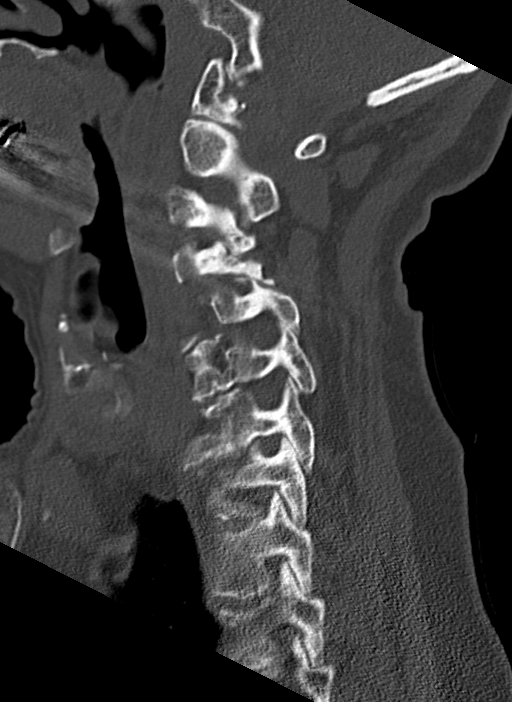
[im 33/65  soft-tissue]
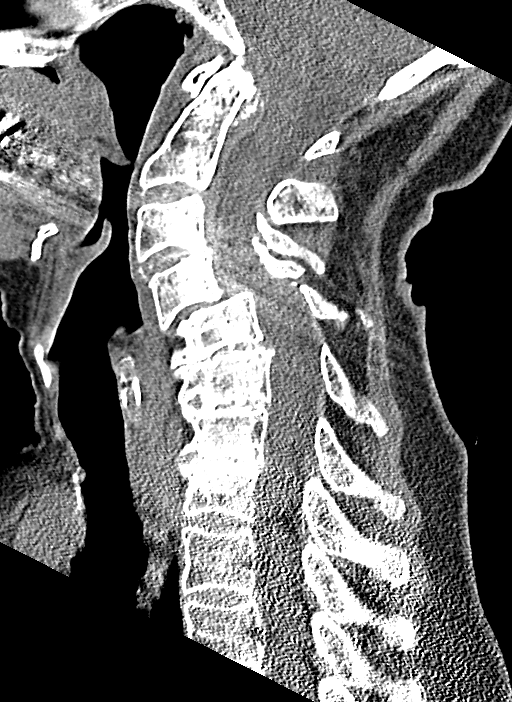
[im 33/65  bone]
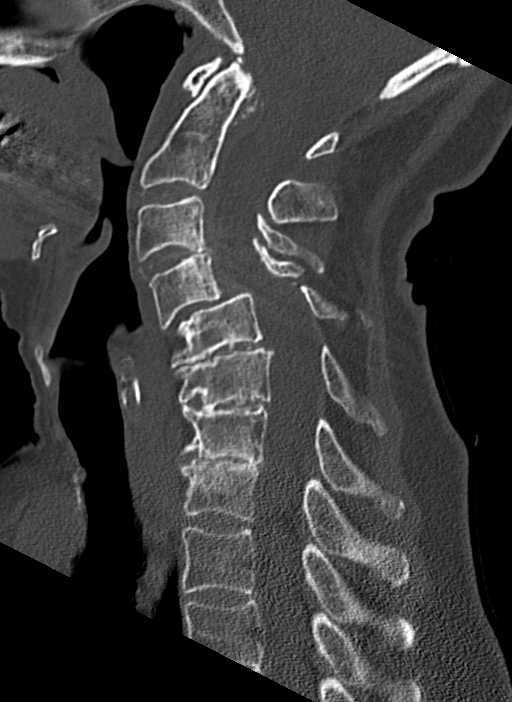
[im 38/65  bone]
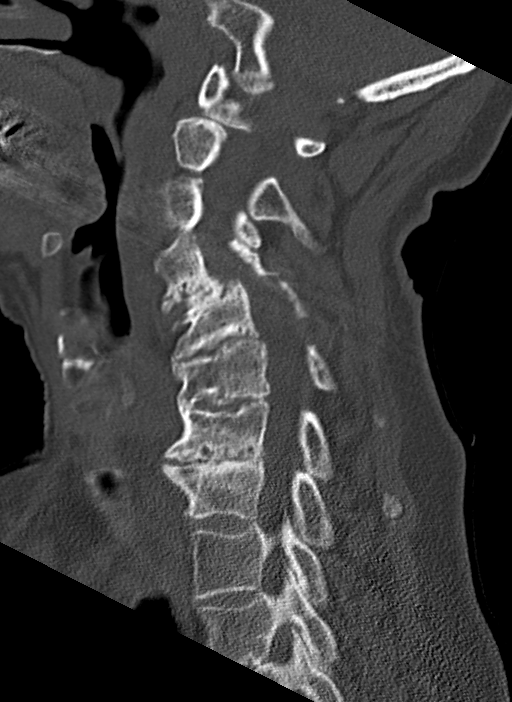
[im 43/65  bone]
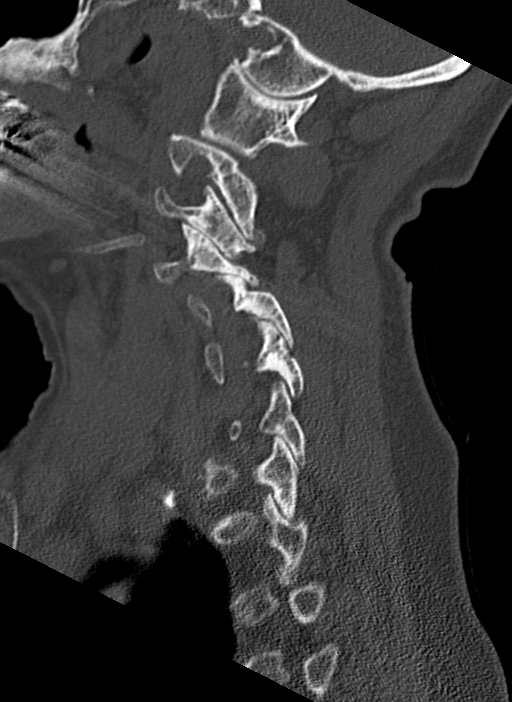

[Series 6: coronal bone · coronal · 0.25mm/px · 3 of 55 slices shown]
[im 11/55  bone]
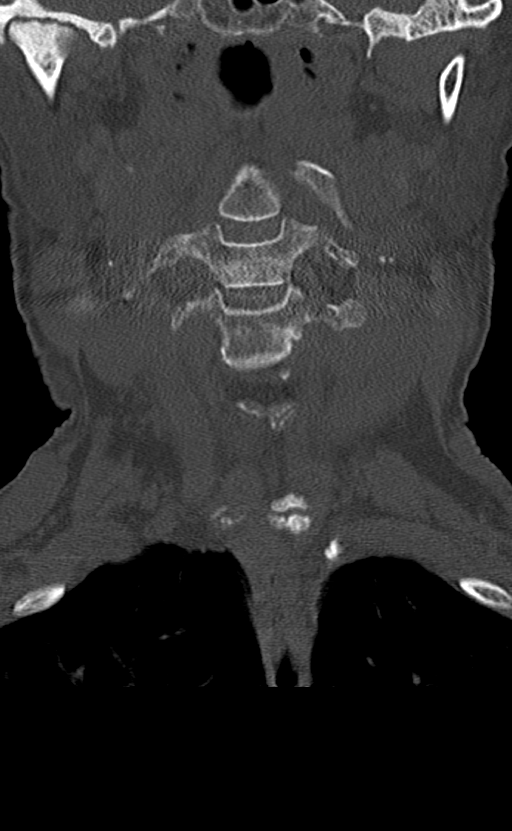
[im 22/55  bone]
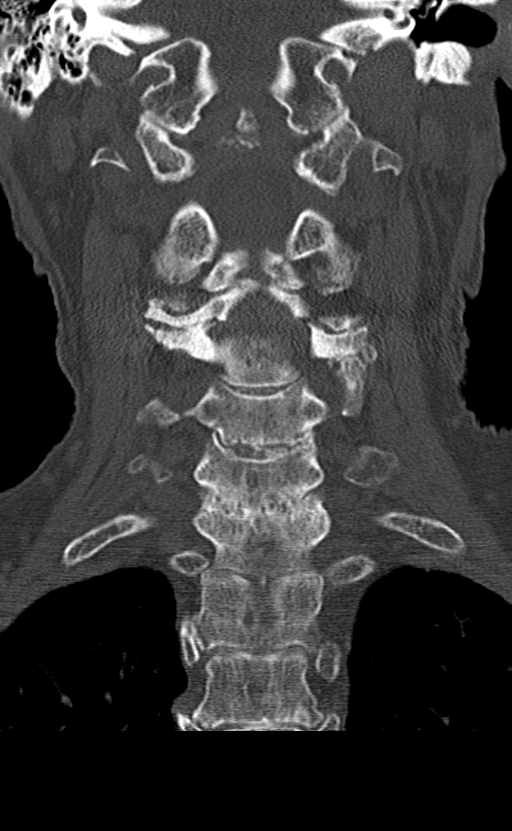
[im 33/55  bone]
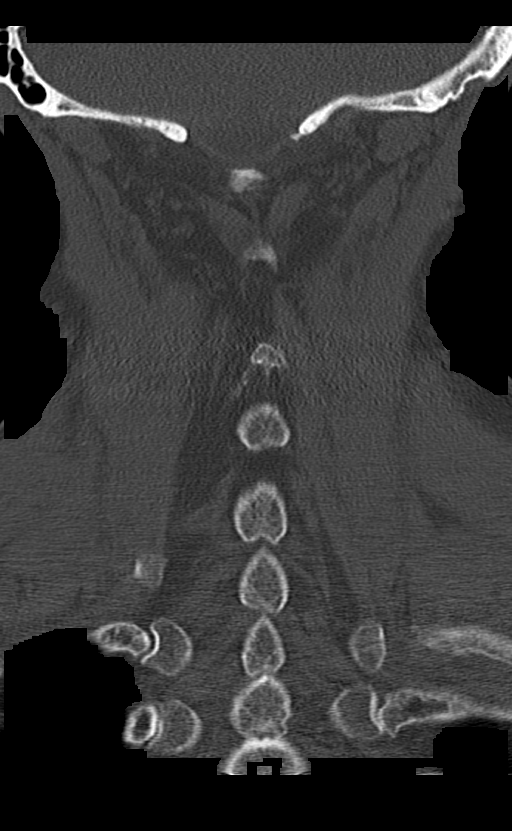

[Series 7: orthogonal bone · axial · 0.21mm/px · z∈[-277,-177]mm · 4 of 85 slices shown, 5 images]
[im 15/85  soft-tissue]
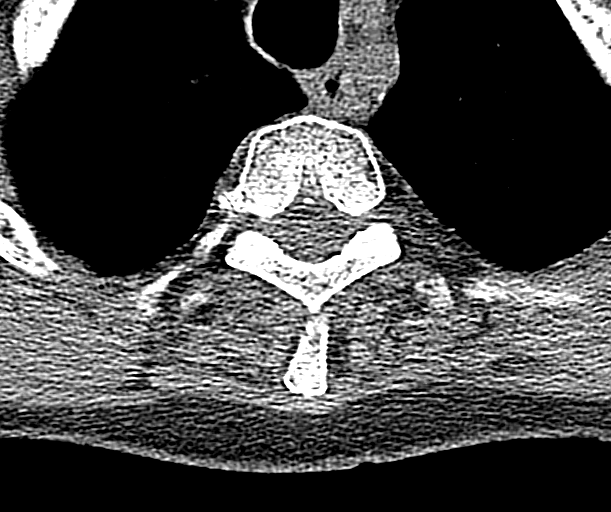
[im 15/85  bone]
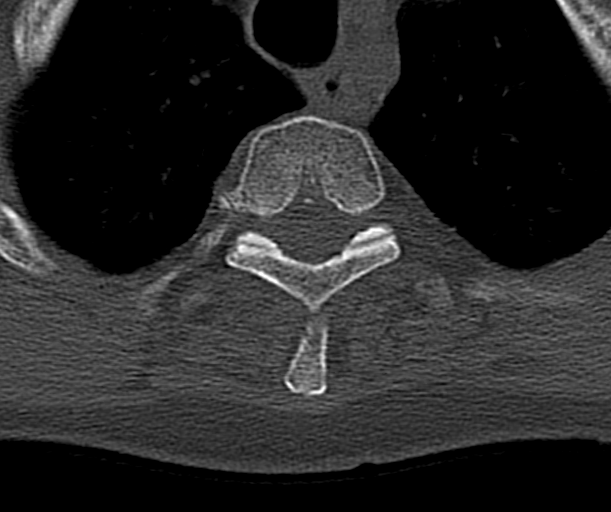
[im 29/85  bone]
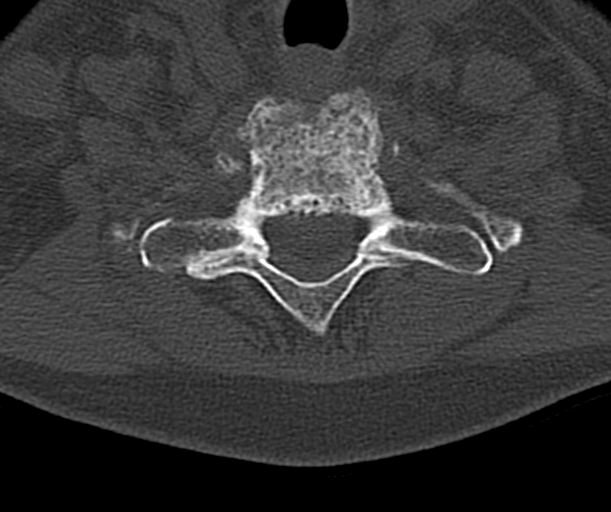
[im 57/85  bone]
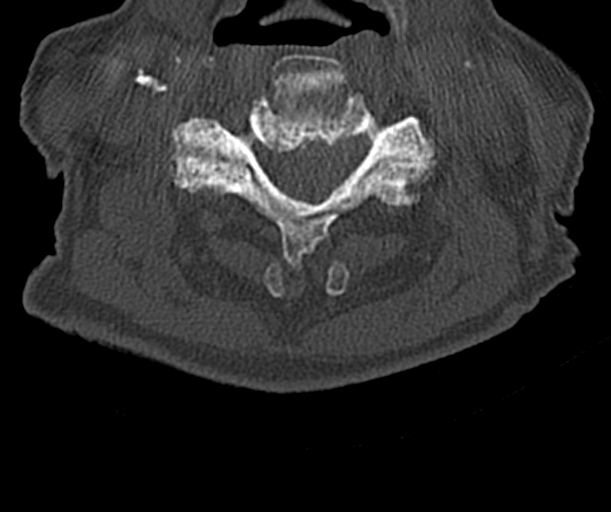
[im 71/85  bone]
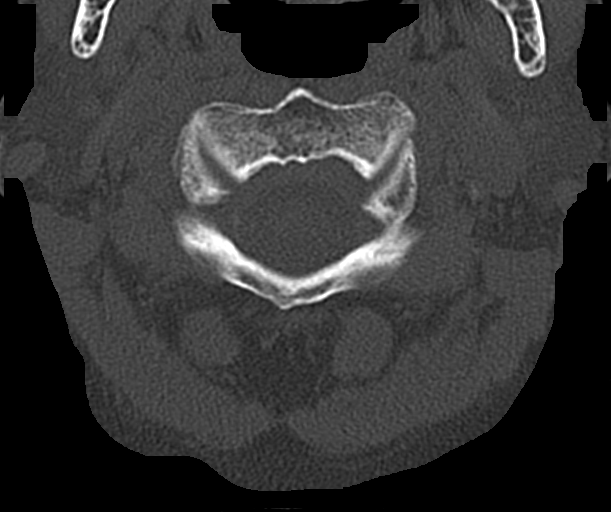

[12 of 33 positions shown; findings below may reference images not displayed]

FINDINGS: Alignment: There is marked (approximately 6 mm) of anterolisthesis
of C4 on C5, which is slightly progressed in comparison to
radiographs from September 20, 2014.

Skull base and vertebrae: No evidence of acute fracture. Vertebral
body heights are maintained.

Soft tissues and spinal canal: No prevertebral fluid or swelling. No
visible canal hematoma.

Disc levels: Moderate to advanced multilevel degenerative disc
disease with disc height loss, endplate sclerosis and cystic change.
Multilevel facet arthropathy, severe at C3-C4 and C4-C5. Suspected
canal and bilateral foraminal stenosis at C4-C5 due to the
anterolisthesis and degenerative change.

Upper chest: Visualized lung apices are clear.
IMPRESSION: 1. No evidence of acute fracture.
2. Marked (approximately 6 mm) of anterolisthesis of C4 on C5, which
is likely degenerative given severe facet arthropathy at this level
and anterolisthesis on the remote prior from 3910 (although the
degree of anterolisthesis likely has progressed some since then).
Suspected resulting canal and foraminal stenosis at this level that
is poorly evaluated by CT. An MRI of the cervical spine could
further evaluate the canal/cord/foramina if clinically indicated.

## 2021-06-21 NOTE — Progress Notes (Signed)
FOLLOW UP Date of Service/Encounter:  06/24/21   Subjective:  [name] (DOB: October 06, 1933) is a 87 y.o. female with a history of hypothyroidism, nummular dermatitis on methotrexate, hypertension, type 2 diabetes, neuropathy, dyslipidemia, vitamin D deficiency who [facility] in re-evaluation of the following: Chronic pruritus History obtained from: chart review and patient.  For Review, LV was on 12/21/18  with Dr. Beaulah Dinning seen for chronic urticaria (on Zyrtec 10 mg in the morning and hydroxyzine 25 mg at night).   Today she presents for follow-up.  Since her last visit she was seen by Dr. Karen Kitchens on 06/05/2021 complaining of chronic pruritus with relief only by prednisone tapers.  She started on prednisone 5 mg daily since 03/05/2021.  Trial of gabapentin was discontinued due to falls.  Of note, she does have primary hypothyroidism.  Today presents for follow-up. She has been following with dermatology for the past 2 years.  She has been told that her rash is eczema.  There have been no biopsies since a biopsy many years ago from her primary care provider.  That biopsy was reportedly due to inflammatory keratosis.  However, she states that the area biopsied was not the typical rash that she now has.  She does have a current flare on her back behind her ear and on her neck.  Clobetasol seems to be the only thing that works as well as steroid injections.  She uses clobetasol twice a day on most days.  She has received 2 steroid injections this year, but does not want to continue on these due to side effects.  She has a known history of osteoporosis.  Zyrtec, Benadryl, hydroxyzine have not worked for her in the past.  She would like to know what other treatment options she has. Her previous allergy testing was positive to cat only.  She does not own a cat.  Allergies as of 06/24/2021       Reactions   Azithromycin Other (See Comments)   Other  Reaction: Other reaction Other reaction(s): Other (See Comments) Other Reaction: Other reaction   Gabapentin Other (See Comments)   FALLS and makes pts body feels "weird" Patient Reports: Falls like Drop Attacks (no warning, just falls to the floor) Patient Reports: "It makes my body feel weird"   Amoxicillin Itching   Codeine    Feldene [piroxicam]    Liver enzyme elevation   Pepcid [famotidine] Other (See Comments)   constipation   Warfarin    Other reaction(s): BRUISING   Atorvastatin Itching        Medication List        Accurate as of June 24, 2021  1:09 PM. If you have any questions, ask your nurse or doctor.          STOP taking these medications    clindamycin 300 MG capsule Commonly known as: CLEOCIN Stopped by: Tonny Bollman, MD   famotidine 20 MG tablet Commonly known as: PEPCID Stopped by: Tonny Bollman, MD   folic acid 1 MG tablet Commonly known as: FOLVITE Stopped by: Tonny Bollman, MD   hydrOXYzine 25 MG tablet Commonly known as: ATARAX Stopped by: Tonny Bollman, MD   methocarbamol 500 MG tablet Commonly known as: ROBAXIN Stopped by: Tonny Bollman, MD   methotrexate 2.5 MG tablet Commonly known as: RHEUMATREX Stopped by: Tonny Bollman, MD   methylPREDNISolone 4 MG Tbpk tablet Commonly known as: MEDROL DOSEPAK Stopped by: Tonny Bollman, MD   OVER  THE COUNTER MEDICATION Stopped by: Tonny Bollman, MD   PRESERVISION AREDS 2 PO Stopped by: Tonny Bollman, MD   triamcinolone cream 0.5 % Commonly known as: KENALOG Stopped by: Tonny Bollman, MD   VITAMIN B 12 PO Stopped by: Tonny Bollman, MD       TAKE these medications    amitriptyline 25 MG tablet Commonly known as: ELAVIL Take by mouth.   AMLODIPINE BESYLATE PO Take 7.5 mg by mouth.   amLODipine 2.5 MG tablet Commonly known as: NORVASC Take 2.5 mg by mouth daily.   ammonium lactate 12 % lotion Commonly known as: LAC-HYDRIN Apply topically.   clobetasol cream 0.05 % Commonly known as:  TEMOVATE Apply 1 application topically 2 (two) times daily.   DULoxetine 60 MG capsule Commonly known as: CYMBALTA Take 1 capsule by mouth daily.   ezetimibe 10 MG tablet Commonly known as: ZETIA Take 10 mg by mouth daily.   losartan 100 MG tablet Commonly known as: COZAAR TAKE ONE TABLET BY MOUTH AT BEDTIME   metoprolol succinate 25 MG 24 hr tablet Commonly known as: TOPROL-XL Take 25 mg by mouth daily.   omeprazole 20 MG capsule Commonly known as: PRILOSEC Take by mouth.   Polyethyl Glycol-Propyl Glycol 0.4-0.3 % Soln Systane   Prolia 60 MG/ML Soln injection Generic drug: denosumab 60 mg.   Synthroid 88 MCG tablet Generic drug: levothyroxine       Past Medical History:  Diagnosis Date   Arthritis    GERD (gastroesophageal reflux disease)    Hypertension    Pulmonary embolism (HCC)    Thyroid disease    Past Surgical History:  Procedure Laterality Date   APPENDECTOMY     CATARACT EXTRACTION     FOOT NEUROMA SURGERY Left 1982   FOOT NEUROMA SURGERY Right [phone]   FOOT SURGERY     IVC FILTER INSERTION Right 2011   KNEE ARTHROSCOPY Right 2011   REPLACEMENT TOTAL KNEE     ROTATOR CUFF REPAIR Left 2003   SQUAMOUS CELL CARCINOMA EXCISION     07/2020   Otherwise, there have been no changes to her past medical history, surgical history, family history, or social history.  ROS: All others negative except as noted per HPI.   Objective:  BP 118/62   Pulse 80   Resp 20   Ht 5' 4.25" (1.632 m)   Wt 126 lb (57.2 kg)   SpO2 98%   BMI 21.46 kg/m  Body mass index is 21.46 kg/m. Physical Exam: General Appearance:  Alert, cooperative, no distress, appears stated age  Head:  Normocephalic, without obvious abnormality, atraumatic  Eyes:  Conjunctiva clear, EOM's intact  Nose: Nares normal  Throat: Lips, tongue normal; teeth and gums normal  Neck: Supple, symmetrical  Lungs:   Clear to auscultation bilaterally, respirations unlabored, no coughing  Heart:   Regular rate and rhythm, no murmur, appears well perfused  Extremities: No edema  Skin: Red flaky area posterior to right ear, erythematous raised papules in a patchy area on left neck, erythematous excoriations on left shoulder blade, inferior bilateral legs erythematous with some excoriations  Neurologic: No gross deficits    Assessment/Plan  On today's exam, patient has multiple types of rashes.  Some of them do appear consistent with possible atopic dermatitis as well as prurigo nodularis.  She also appears to have stasis dermatitis on her inferior legs.  I think a biopsy would be helpful.  She does seem to respond well to topical steroids as well as  injectable steroids, but understandably does not want to continue on these medication due to side effects profile.  I think she would be a great candidate for Dupixent, so we gave her a loading dose today in clinic, and we will start the paperwork for her.  I believe this will be helpful.  If she is not responsive, then a biopsy would be necessary. I will update her allergy testing via blood work today, but doubt this is a major inciting factor to her current rash.  Patient Instructions  Chronic itching with rash (eczema + prurigo nodularis + stasis dermatitis):  - continue clobetasol 0.05% up to twice daily on body- do not use on face, groin or armpits - sample Dupixent given today for eczema and prurigo nodularis - 600 mg loading dose given - we will submit paperwork today for approval - continue moisturization with hypoallergenic ointment at least twice daily-vaseline, cerave, vanicream, aquaphor, aveeno are all great options - use compression stockings on your legs to help with the rash on your legs - labs today: environmental allergies (we will update your testing today, but doubt these are playing a major role in your symptoms)   Follow-up in 8 weeks.    It was a pleasure meeting you in clinic today! If you receive a survey about  today's experience, please consider giving [place] feedback on how we're doing!  Tonny Bollman, MD [facility]     No follow-ups on file.  Tonny Bollman, MD  [facility]

## 2021-06-24 ENCOUNTER — Encounter: Payer: Self-pay | Admitting: Internal Medicine

## 2021-06-24 ENCOUNTER — Other Ambulatory Visit: Payer: Self-pay

## 2021-06-24 ENCOUNTER — Ambulatory Visit (INDEPENDENT_AMBULATORY_CARE_PROVIDER_SITE_OTHER): Payer: Medicare Other | Admitting: Internal Medicine

## 2021-06-24 VITALS — BP 118/62 | HR 80 | Resp 20 | Ht 64.25 in | Wt 126.0 lb

## 2021-06-24 DIAGNOSIS — L281 Prurigo nodularis: Secondary | ICD-10-CM | POA: Insufficient documentation

## 2021-06-24 DIAGNOSIS — L2084 Intrinsic (allergic) eczema: Secondary | ICD-10-CM | POA: Diagnosis not present

## 2021-06-24 DIAGNOSIS — R21 Rash and other nonspecific skin eruption: Secondary | ICD-10-CM | POA: Diagnosis not present

## 2021-06-24 DIAGNOSIS — I872 Venous insufficiency (chronic) (peripheral): Secondary | ICD-10-CM

## 2021-06-24 MED ORDER — DUPILUMAB 300 MG/2ML ~~LOC~~ SOSY
600.0000 mg | PREFILLED_SYRINGE | Freq: Once | SUBCUTANEOUS | Status: AC
  Administered 2021-06-24: 600 mg via SUBCUTANEOUS

## 2021-06-24 NOTE — Patient Instructions (Addendum)
Chronic itching with rash (eczema + prurigo nodularis + stasis dermatitis):  - continue clobetasol 0.05% up to twice daily on body- do not use on face, groin or armpits - sample Dupixent given today for eczema and prurigo nodularis - 600 mg loading dose given - we will submit paperwork today for approval - continue moisturization with hypoallergenic ointment at least twice daily-vaseline, cerave, vanicream, aquaphor, aveeno are all great options - use compression stockings on your legs to help with the rash on your legs - labs today: environmental allergies (we will update your testing today, but doubt these are playing a major role in your symptoms)   Follow-up in 8 weeks.    It was a pleasure meeting you in clinic today! If you receive a survey about today's experience, please consider giving [place] feedback on how we're doing!  Tonny Bollman, MD [facility]

## 2021-06-26 ENCOUNTER — Telehealth: Payer: Self-pay | Admitting: *Deleted

## 2021-06-26 NOTE — Telephone Encounter (Signed)
Tried to call patient mobile number and voicemail not set up so l/m on patient home phone to give me a call to discuss Dupixent which patient started in clinic on 12/5. Will need to get her Part D card info and discuss affordability with [employer] and possible PAP

## 2021-06-26 NOTE — Telephone Encounter (Addendum)
-----   Message from Maurine Simmering, CMA sent at 06/24/2021  4:50 PM EST ----- Regarding: Dupixent Start Demia signed consent and received loading dose of Dupixent today for Asthma per Dr. Maurine Minister. Please submit to insurance.  Tonny Bollman, MD  Devoria Glassing, CMA Patient has eczema and prurigo nodularis.  She is using clobetasol topical steroid cream most days of the week.  Received 2 steroid injections this year.  We gave sample Dupixent in clinic, can we get her approved for this? I would be fine with her doing at home if we can get it approved.

## 2021-06-26 NOTE — Telephone Encounter (Signed)
Spoke to patient and advised patient assistance and will mail app and info for her to return to me.  I will be in touch once app submitted with status

## 2021-06-26 NOTE — Telephone Encounter (Signed)
Thank you Tammy!

## 2021-06-30 LAB — ALLERGENS, ZONE 2

## 2021-07-01 NOTE — Progress Notes (Signed)
Please let Ms Virgo know that her environmental allergy profile was negative. Please also ask if she has heard yet about Dupixent patient assistance.

## 2021-07-23 ENCOUNTER — Institutional Professional Consult (permissible substitution): Payer: Medicare Other | Admitting: Neurology

## 2021-08-14 ENCOUNTER — Other Ambulatory Visit: Payer: Self-pay | Admitting: *Deleted

## 2021-08-14 MED ORDER — DUPIXENT 300 MG/2ML ~~LOC~~ SOSY: 600.0000 mg | PREFILLED_SYRINGE | Freq: Once | SUBCUTANEOUS | 11 refills | Status: DC

## 2021-08-14 MED ORDER — DUPIXENT 300 MG/2ML ~~LOC~~ SOSY: 600.0000 mg | PREFILLED_SYRINGE | Freq: Once | SUBCUTANEOUS | 11 refills | Status: AC

## 2021-08-22 NOTE — Progress Notes (Signed)
FOLLOW UP Date of Service/Encounter:  08/23/21   Subjective:  [name] (DOB: 1933-08-03) is a 88 y.o. female with PMHx history of hypothyroidism, nummular dermatitis on methotrexate, hypertension, type 2 diabetes, neuropathy, dyslipidemia, vitamin D deficiency who [facility] in re-evaluation of the following: chronic pruritus with rash History obtained from: chart review and patient.  For Review, LV was on 06/24/21  with Dr.Reve Crocket.  Exam revealed possible atopic dermatitis with prurigo nodularis + some areas of stasis dermatitis.  We gave her a sample of Dupixent and applied for approval. Discussed compression stockings for her legs. Environmental blood testing negative (allergies not thought to be contributing to rash). Discussed if no improvement with Dupixent, consider follow-up biopsy.  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 Beaulah Dinning controlled on zyrtec 10 mg in AM and hydroxyzine 25 mg in PM, lost to follow-up, being seen by Dr. Karen Kitchens and controlled only with prednisone tapers (taking prednisone 5 mg daily since 03/05/21) and clobetasol BID; trial of gabapentin discontinued due to falls -Dermatology had diagnosed her with eczema with non-revealing biopsy - previous allergy testing + to cat (doesn't own a cat) - blood draw 06/27/21: allergen zone 2 (environmentals): negative  Today presents for follow-up. She reports that she was initially doing well with the Dupixent injections, but has not had an injection over 1 month.  She was starting to notice a difference in the frequency and intensity of the rash and itching.  She does still tend to get relief from clobetasol topical ointment as needed.  She has been approved for Dupixent, and is supposed to be getting her shipment either today or Monday.  She would like to do them at home and will bring her shipment here if she has any concerns with  home injections. She wonders whether her rash is secondary to allergies.  Discussed that environmental allergy panel has been negative up to this point.  Food allergy would not typically present this way, but I am happy to provide food allergy testing for her as a means of reassurance.  She has several other health concerns-particularly her ataxia-that are creating a significant amount of contemplation and concern.  She feels that any means of reassurance regarding her health would be valuable information.  Additionally she wonders if a certain product or clothing item etc. could be causing her rash.  Discussed that this type of allergy is called contact allergy, and patch testing is the preferred method for diagnosis.   Allergies as of 08/23/2021       Reactions   Azithromycin Other (See Comments)   Other Reaction: Other reaction Other reaction(s): Other (See Comments) Other Reaction: Other reaction   Gabapentin Other (See Comments)   FALLS and makes pts body feels "weird" Patient Reports: Falls like Drop Attacks (no warning, just falls to the floor) Patient Reports: "It makes my body feel weird"   Amoxicillin Itching   Codeine    Feldene [piroxicam]    Liver enzyme elevation   Pepcid [famotidine] Other (See Comments)   constipation   Warfarin    Other reaction(s): BRUISING   Atorvastatin Itching        Medication List        Accurate as of August 23, 2021  1:02 PM. If you have any questions, ask your nurse or doctor.          amitriptyline 25 MG tablet Commonly known as: ELAVIL Take by  mouth.   AMLODIPINE BESYLATE PO Take 7.5 mg by mouth.   amLODipine 2.5 MG tablet Commonly known as: NORVASC Take 2.5 mg by mouth daily.   ammonium lactate 12 % lotion Commonly known as: LAC-HYDRIN Apply topically.   clobetasol cream 0.05 % Commonly known as: TEMOVATE Apply 1 application topically 2 (two) times daily.   DULoxetine 60 MG capsule Commonly known as: CYMBALTA Take 1  capsule by mouth daily.   ezetimibe 10 MG tablet Commonly known as: ZETIA Take 10 mg by mouth daily.   losartan 100 MG tablet Commonly known as: COZAAR TAKE ONE TABLET BY MOUTH AT BEDTIME   metoprolol succinate 25 MG 24 hr tablet Commonly known as: TOPROL-XL Take 25 mg by mouth daily.   omeprazole 20 MG capsule Commonly known as: PRILOSEC Take by mouth.   Polyethyl Glycol-Propyl Glycol 0.4-0.3 % Soln Systane   Prolia 60 MG/ML Soln injection Generic drug: denosumab 60 mg.   Synthroid 88 MCG tablet Generic drug: levothyroxine       Past Medical History:  Diagnosis Date   Arthritis    GERD (gastroesophageal reflux disease)    Hypertension    Pulmonary embolism (HCC)    Thyroid disease    Past Surgical History:  Procedure Laterality Date   APPENDECTOMY     CATARACT EXTRACTION     FOOT NEUROMA SURGERY Left 1982   FOOT NEUROMA SURGERY Right [id number]   FOOT SURGERY     IVC FILTER INSERTION Right 2011   KNEE ARTHROSCOPY Right 2011   REPLACEMENT TOTAL KNEE     ROTATOR CUFF REPAIR Left 2003   SQUAMOUS CELL CARCINOMA EXCISION     07/2020   Otherwise, there have been no changes to her past medical history, surgical history, family history, or social history.  ROS: All others negative except as noted per HPI.   Objective:  BP 130/64    Pulse 70    Temp 98.7 F (37.1 C) (Temporal)    Resp 18    SpO2 98%  There is no height or weight on file to calculate BMI. Physical Exam: General Appearance:  Alert, cooperative, no distress, appears stated age  Head:  Normocephalic, without obvious abnormality, atraumatic  Eyes:  Conjunctiva clear, EOM's intact  Nose: Nares normal  Throat: Lips, tongue normal; teeth and gums normal  Neck: Supple, symmetrical  Lungs:   Respirations unlabored, no coughing  Heart:  Appears well perfused  Extremities: No edema  Skin: Erythematous mildly edematous bilateral lower extremities, some excoriations  Neurologic: No gross deficits    Assessment/Plan   Patient Instructions  Chronic itching with rash (eczema + prurigo nodularis + stasis dermatitis?):  - continue clobetasol 0.05% up to twice daily on body- do not use on face, groin or armpits - continue Dupixent for suspected eczema and prurigo nodularis - continue moisturization with hypoallergenic ointment at least twice daily-vaseline, cerave, vanicream, aquaphor, aveeno are all great options - use compression stockings on your legs to help with the rash on your legs - your repeat allergy testing to environmental was negative on 06/27/21 - do not suspect food allergy, but am happy to set you up for testing for reassurance - we can also do patch testing if you would like  If you decide to do food allergy testing, must be off antihistamines for 3 days prior to appointment. For patch testing, must be off topical steroids for 2 weeks and any oral or injections of steroids for 4 weeks.   Follow-up in 3  months, sooner if needed.  It was a pleasure meeting you in clinic today!   Tonny Bollman, MD [facility]       Tonny Bollman, MD  [facility]

## 2021-08-23 ENCOUNTER — Ambulatory Visit (INDEPENDENT_AMBULATORY_CARE_PROVIDER_SITE_OTHER): Payer: Medicare Other | Admitting: Internal Medicine

## 2021-08-23 ENCOUNTER — Encounter: Payer: Self-pay | Admitting: Internal Medicine

## 2021-08-23 ENCOUNTER — Other Ambulatory Visit: Payer: Self-pay

## 2021-08-23 VITALS — BP 130/64 | HR 70 | Temp 98.7°F | Resp 18

## 2021-08-23 DIAGNOSIS — I872 Venous insufficiency (chronic) (peripheral): Secondary | ICD-10-CM

## 2021-08-23 DIAGNOSIS — L281 Prurigo nodularis: Secondary | ICD-10-CM

## 2021-08-23 DIAGNOSIS — L2084 Intrinsic (allergic) eczema: Secondary | ICD-10-CM

## 2021-08-23 DIAGNOSIS — R21 Rash and other nonspecific skin eruption: Secondary | ICD-10-CM | POA: Diagnosis not present

## 2021-08-23 MED ORDER — CLOBETASOL PROPIONATE 0.05 % EX CREA: 1.0000 "application " | TOPICAL_CREAM | Freq: Two times a day (BID) | CUTANEOUS | 2 refills | Status: AC | PRN

## 2021-08-23 NOTE — Patient Instructions (Addendum)
Chronic itching with rash (eczema + prurigo nodularis + stasis dermatitis?):  - continue clobetasol 0.05% up to twice daily on body- do not use on face, groin or armpits - continue Dupixent for suspected eczema and prurigo nodularis, if no improvement after 3 months, would refer to dermatology for repeat biopsy - continue moisturization with hypoallergenic ointment at least twice daily-vaseline, cerave, vanicream, aquaphor, aveeno are all great options - use compression stockings on your legs to help with the rash on your legs - your repeat allergy testing to environmental was negative on 06/27/21 - do not suspect food allergy, but am happy to set you up for testing for reassurance - we can also do patch testing if you would like  If you decide to do food allergy testing, must be off antihistamines for 3 days prior to appointment. For patch testing, must be off topical steroids for 2 weeks and any oral or injections of steroids for 4 weeks.   Follow-up in 3 months, sooner if needed.  It was a pleasure meeting you in clinic today!   Tonny Bollman, MD [facility]

## 2021-08-27 ENCOUNTER — Other Ambulatory Visit: Payer: Self-pay

## 2021-08-27 ENCOUNTER — Ambulatory Visit (INDEPENDENT_AMBULATORY_CARE_PROVIDER_SITE_OTHER): Payer: Medicare Other

## 2021-08-27 DIAGNOSIS — L209 Atopic dermatitis, unspecified: Secondary | ICD-10-CM

## 2021-08-27 MED ORDER — DUPILUMAB 300 MG/2ML ~~LOC~~ SOSY
300.0000 mg | PREFILLED_SYRINGE | Freq: Once | SUBCUTANEOUS | Status: AC
  Administered 2021-08-27: 300 mg via SUBCUTANEOUS

## 2021-08-27 NOTE — Progress Notes (Signed)
Immunotherapy   Patient Details  Name: CHELISA HENNEN MRN: [id number] Date of Birth: [birth year]  08/27/2021  Emily Filbert Cavan received 300 mg of Dupixent today.  She will be taking the other 300 mg home to start giving it to herself every 2 weeks. Instructed patient how to give the Dupixent to herself and in what areas. She verbalized understanding. No problems after wait. Advised to call office with any questions or concerns.    Shaquel Josephson J Lilo Wallington 08/27/2021, 11:33 AM

## 2021-10-15 ENCOUNTER — Other Ambulatory Visit: Payer: Self-pay | Admitting: Internal Medicine

## 2021-10-15 NOTE — Telephone Encounter (Signed)
Is this Rx okay to send? Pt last seen 08/23/21.  ?

## 2021-10-15 NOTE — Telephone Encounter (Signed)
Patient is asking for an RX for ammonium lactate (LAC-HYDRIN) 12 % lotion [id number]  Patient states this helps her with cellulitis and she is now out of this medication please advise  ?

## 2021-10-16 MED ORDER — AMMONIUM LACTATE 12 % EX LOTN: TOPICAL_LOTION | CUTANEOUS | 1 refills | Status: DC | PRN

## 2021-10-31 ENCOUNTER — Ambulatory Visit (INDEPENDENT_AMBULATORY_CARE_PROVIDER_SITE_OTHER): Payer: Medicare Other

## 2021-10-31 ENCOUNTER — Encounter: Payer: Self-pay | Admitting: Orthopaedic Surgery

## 2021-10-31 ENCOUNTER — Ambulatory Visit (INDEPENDENT_AMBULATORY_CARE_PROVIDER_SITE_OTHER): Payer: Medicare Other | Admitting: Orthopaedic Surgery

## 2021-10-31 DIAGNOSIS — G8929 Other chronic pain: Secondary | ICD-10-CM

## 2021-10-31 DIAGNOSIS — M25561 Pain in right knee: Secondary | ICD-10-CM

## 2021-10-31 DIAGNOSIS — T8484XA Pain due to internal orthopedic prosthetic devices, implants and grafts, initial encounter: Secondary | ICD-10-CM | POA: Diagnosis not present

## 2021-10-31 DIAGNOSIS — Z96651 Presence of right artificial knee joint: Secondary | ICD-10-CM | POA: Diagnosis not present

## 2021-10-31 NOTE — Progress Notes (Signed)
? ?Office Visit Note ?  ?Patient: [name]           ?Date of Birth: [birth year]           ?MRN: [id number] ?Visit Date: 10/31/2021 ?             ?Requested by: Kelvin Cellar, [address],  [place] [id number] ?PCP: Kelvin Cellar, MD ? ? ?Assessment & Plan: ?Visit Diagnoses:  ?1. Chronic pain of right knee   ?2. Pain due to total right knee replacement, initial encounter [facility])   ? ? ?Plan: Mrs. Graser had a right total knee replacement approximately 11 years ago and relates she was doing well until just recently.  She has had episodes where her knee would lock and she have to "shake it to loosen it".  She has been seen by her local physician and may be even an orthopedist in [facility] who placed her on meloxicam.  She is not sure its made much of a difference.  Her past history is significant that she has been diagnosed with ataxia and uses a walker.  She cares for her husband at home and is very active with housework.  She does not have much time to exercise but is on her feet a good part of the day.  Her x-rays were negative for any obvious abnormality of the total knee components.  She certainly has a lot of atrophy over the thigh musculature I think that may be because of her problem.  She may have some abnormal movement of the polyethylene components of either that the left leg tibial component or possibly even the patella.  There was no knee effusion knee was not warm or hot.  There were no localized areas of tenderness.  She does have some skin changes distally with a history of cellulitis. ? ?I think the biggest issue is is the weakness of her leg and I think if she will get involved in exercise program to strengthen the quads and hamstrings that it would make a big difference.  Her knee is not giving way so I do not think she needs a knee support.  I am happy to help in any way but she needs to find out what facility would be most convenient for her.  She is going to check for some  places in [facility] and call us back and then I will make the referral.  Ideally it would be nice to get her on an exercise bicycle and maybe work with some of the machines mentored by one of the therapists.  Long discussion over 45 minutes with Mrs. Housh her daughter and her husband and answered all questions. ? ?Follow-Up Instructions: Return if symptoms worsen or fail to improve.  ? ?Orders:  ?Orders Placed This Encounter  ?Procedures  ? XR KNEE 3 VIEW RIGHT  ? ?No orders of the defined types were placed in this encounter. ? ? ? ? Procedures: ?No procedures performed ? ? ?Clinical Data: ?No additional findings. ? ? ?Subjective: ?Chief Complaint  ?Patient presents with  ? Right Knee - Pain  ?Patient presents today for her right knee. She said that she has been battling a wound area at her proximal shin that has been red and swollen for months. She then saw her PCP and was told it was cellulitis from a skin cancer area. She then noticed that she was having pain in her knee. She said that it locks up and tends  to mainly due this at night. She ices at night to help. She states that she went to [facility] and was given Meloxicam to take, which seemed to really help. She has been referred here for this knee because she had a right total knee arthroplasty with Dr.Shade Rivenbark 11years ago.  ? ?HPI ? ?Review of Systems ? ? ?Objective: ?Vital Signs: There were no vitals taken for this visit. ? ?Physical Exam ?Constitutional:   ?   Appearance: She is well-developed.  ?Eyes:  ?   Pupils: Pupils are equal, round, and reactive to light.  ?Pulmonary:  ?   Effort: Pulmonary effort is normal.  ?Skin: ?   General: Skin is warm and dry.  ?Neurological:  ?   Mental Status: She is alert and oriented to person, place, and time.  ?Psychiatric:     ?   Behavior: Behavior normal.  ? ? ?Ortho Exam knee was not hot red warm or swollen.  There was no effusion.  She opens up about 3 to 4 mm medially with a valgus stress but  negative anterior drawer sign.  No localized areas of tenderness.  Patella tracks in the midline.  This certainly is a lot of atrophy of her quads or hamstrings but she is able to fully extend her knee and flex well over 100 degrees.  No popliteal pain or mass.  No calf pain.  Does have venous stasis changes distally in that same leg and there is been of an apparent history of cellulitis in the past.  Did not appear to have any active infection. ? ?Specialty Comments:  ?No specialty comments available. ? ?Imaging: ?XR KNEE 3 VIEW RIGHT ? ?Result Date: 10/31/2021 ?Films of the right total knee replacement were obtained in several projections.  I did not see any obvious evidence of loosening or fracture.  Components appear to be well aligned.  There was a little narrowing of the medial joint space possibly consistent with some polyethylene wear but did not appear to be an abnormality on the lateral view.  ? ? ?PMFS History: ?Patient Active Problem List  ? Diagnosis Date Noted  ? Painful total knee replacement, right ([place]) 10/31/2021  ? Prurigo nodularis 06/24/2021  ? Other specified hypothyroidism 12/21/2018  ? Chronic urticaria 12/21/2018  ? Intrinsic atopic dermatitis 12/07/2018  ? Allergic urticaria 12/07/2018  ? Gastroesophageal reflux disease without esophagitis 12/07/2018  ? Age-related osteoporosis without current pathological fracture 12/07/2018  ? Essential hypertension 12/07/2018  ? Allergic rhinitis due to animal hair and dander 12/07/2018  ? Radiculopathy due to lumbar intervertebral disc disorder 05/31/2018  ? Radiculopathy of cervical region 05/31/2018  ? Peripheral neuropathy 05/21/2018  ? Multifactorial gait disorder 04/26/2018  ? Right foot drop 04/26/2018  ? Cerebellar ataxia in diseases classified elsewhere ([place]) 04/26/2018  ? Sensorineural hearing loss (SNHL) of right ear 04/26/2018  ? Tinnitus aurium, right 04/26/2018  ? ?Past Medical History:  ?Diagnosis Date  ? Arthritis   ? GERD  (gastroesophageal reflux disease)   ? Hypertension   ? Pulmonary embolism ([place])   ? Thyroid disease   ?  ?Family History  ?Problem Relation Age of Onset  ? Asthma Sister   ? Asthma Brother   ? Allergic rhinitis Neg Hx   ? Angioedema Neg Hx   ? Eczema Neg Hx   ? Immunodeficiency Neg Hx   ? Urticaria Neg Hx   ?  ?Past Surgical History:  ?Procedure Laterality Date  ? APPENDECTOMY    ? CATARACT  EXTRACTION    ? FOOT NEUROMA SURGERY Left 1982  ? FOOT NEUROMA SURGERY Right [id number]  ? FOOT SURGERY    ? IVC FILTER INSERTION Right 2011  ? KNEE ARTHROSCOPY Right 2011  ? REPLACEMENT TOTAL KNEE    ? ROTATOR CUFF REPAIR Left 2003  ? SQUAMOUS CELL CARCINOMA EXCISION    ? 07/2020  ? ?Social History  ? ?Occupational History  ? Not on file  ?Tobacco Use  ? Smoking status: Never  ? Smokeless tobacco: Never  ?Vaping Use  ? Vaping Use: Never used  ?Substance and Sexual Activity  ? Alcohol use: No  ? Drug use: Never  ? Sexual activity: Not on file  ? ? ? ? ? ? ?

## 2021-11-11 ENCOUNTER — Telehealth: Payer: Self-pay | Admitting: Orthopaedic Surgery

## 2021-11-11 NOTE — Telephone Encounter (Signed)
Pt is calling to leave a msg for Dr Durward Fortes. ? ?Implant is hanging up , rather she is sitting , laying, and she cant walk with it when it does that , takes a moment to loosen the implant up. ? ?PCP has prescribed Tramadol  ? ?Is the pain going to calm down with the pain medication --or should she do PT also  ?

## 2021-11-12 ENCOUNTER — Other Ambulatory Visit: Payer: Self-pay

## 2021-11-12 DIAGNOSIS — G8929 Other chronic pain: Secondary | ICD-10-CM

## 2021-11-12 NOTE — Telephone Encounter (Signed)
Think she should try PT-please call and arrange at facility near to her home.

## 2021-11-12 NOTE — Telephone Encounter (Signed)
Tried to call patient. No answer. LMOM that I have entered an order for PT. ?

## 2021-11-19 NOTE — Progress Notes (Deleted)
FOLLOW UP Date of Service/Encounter:  11/19/21   Subjective:  [name] (DOB: [birth year]) is a 88 y.o. female who returns to the [facility] on 11/21/2021 in re-evaluation of the following: *** History obtained from: chart review and {Persons; PED relatives w/patient:19415::"patient"}.  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 She had concern about food allergies and we offered to do testing for her reassurance as this was causing significant preoccupation for her though explained her symptoms were not consistent with food allergy.    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 Shaune Leeks controlled on zyrtec 10 mg in AM and hydroxyzine 25 mg in PM, lost to follow-up, being seen by Dr. Awilda Metro and controlled only with prednisone tapers (taking prednisone 5 mg daily since 03/05/21) and clobetasol BID; trial of gabapentin discontinued due to falls -Dermatology had diagnosed her with eczema with non-revealing biopsy - previous allergy testing + to cat (doesn't own a cat) - blood draw 06/27/21: allergen zone 2 (environmentals): negative - dupixent started on 08/27/21; doing home injections  Today presents for follow-up.  Allergies as of 11/21/2021       Reactions   Azithromycin Other (See Comments)   Other Reaction: Other reaction Other reaction(s): Other (See Comments) Other Reaction: Other reaction   Gabapentin Other (See Comments)   FALLS and makes pts body feels "weird" Patient Reports: Falls like Drop Attacks (no warning, just falls to the floor) Patient Reports: "It makes my body feel weird"   Amoxicillin Itching   Codeine    Feldene [piroxicam]    Liver enzyme elevation   Pepcid [famotidine] Other (See Comments)   constipation   Warfarin    Other reaction(s): BRUISING   Atorvastatin Itching         Medication List        Accurate as of Nov 19, 2021 12:42 PM. If you have any questions, ask your nurse or doctor.          amitriptyline 25 MG tablet Commonly known as: ELAVIL Take by mouth.   AMLODIPINE BESYLATE PO Take 7.5 mg by mouth.   amLODipine 2.5 MG tablet Commonly known as: NORVASC Take 2.5 mg by mouth daily.   ammonium lactate 12 % lotion Commonly known as: LAC-HYDRIN Apply topically as needed for dry skin.   clobetasol cream 0.05 % Commonly known as: TEMOVATE Apply 1 application topically 2 (two) times daily as needed.   DULoxetine 60 MG capsule Commonly known as: CYMBALTA Take 1 capsule by mouth daily.   ezetimibe 10 MG tablet Commonly known as: ZETIA Take 10 mg by mouth daily.   losartan 100 MG tablet Commonly known as: COZAAR TAKE ONE TABLET BY MOUTH AT BEDTIME   metoprolol succinate 25 MG 24 hr tablet Commonly known as: TOPROL-XL Take 25 mg by mouth daily.   omeprazole 20 MG capsule Commonly known as: PRILOSEC Take by mouth.   Polyethyl Glycol-Propyl Glycol 0.4-0.3 % Soln Systane   Prolia 60 MG/ML Soln injection Generic drug: denosumab 60 mg.   Synthroid 88 MCG tablet Generic drug: levothyroxine       Past Medical History:  Diagnosis Date   Arthritis    GERD (gastroesophageal reflux disease)    Hypertension    Pulmonary embolism (HCC)    Thyroid disease    Past Surgical History:  Procedure Laterality Date  APPENDECTOMY     CATARACT EXTRACTION     FOOT NEUROMA SURGERY Left 1982   FOOT NEUROMA SURGERY Right [id number]   FOOT SURGERY     IVC FILTER INSERTION Right 2011   KNEE ARTHROSCOPY Right 2011   REPLACEMENT TOTAL KNEE     ROTATOR CUFF REPAIR Left 2003   SQUAMOUS CELL CARCINOMA EXCISION     07/2020   Otherwise, there have been no changes to her past medical history, surgical history, family history, or social history.  ROS: All others negative except as noted per HPI.   Objective:  There were no vitals  taken for this visit. There is no height or weight on file to calculate BMI. Physical Exam: General Appearance:  Alert, cooperative, no distress, appears stated age  Head:  Normocephalic, without obvious abnormality, atraumatic  Eyes:  Conjunctiva clear, EOM's intact  Nose: Nares normal, {Blank multiple:19196:a:"***","hypertrophic turbinates","normal mucosa","no visible anterior polyps","septum midline"}  Throat: Lips, tongue normal; teeth and gums normal, {Blank multiple:19196:a:"***","normal posterior oropharynx","tonsils 2+","tonsils 3+","no tonsillar exudate","+ cobblestoning"}  Neck: Supple, symmetrical  Lungs:   {Blank multiple:19196:a:"***","clear to auscultation bilaterally","end-expiratory wheezing","wheezing throughout"}, Respirations unlabored, {Blank multiple:19196:a:"***","no coughing","intermittent dry coughing"}  Heart:  {Blank multiple:19196:a:"***","regular rate and rhythm","no murmur"}, Appears well perfused  Extremities: No edema  Skin: Skin color, texture, turgor normal, no rashes or lesions on visualized portions of skin  Neurologic: No gross deficits   Reviewed: ***  Spirometry:  Tracings reviewed. Her effort: {Blank single:19197::"Good reproducible efforts.","It was hard to get consistent efforts and there is a question as to whether this reflects a maximal maneuver.","Poor effort, data can not be interpreted.","Variable effort-results affected.","decent for first attempt at spirometry."} FVC: ***L FEV1: ***L, ***% predicted FEV1/FVC ratio: ***% Interpretation: {Blank single:19197::"Spirometry consistent with mild obstructive disease","Spirometry consistent with moderate obstructive disease","Spirometry consistent with severe obstructive disease","Spirometry consistent with possible restrictive disease","Spirometry consistent with mixed obstructive and restrictive disease","Spirometry uninterpretable due to technique","Spirometry consistent with normal pattern","No overt  abnormalities noted given today's efforts"}.  Please see scanned spirometry results for details.  Skin Testing: {Blank single:19197::"Select foods","Environmental allergy panel","Environmental allergy panel and select foods","Food allergy panel","None","Deferred due to recent antihistamines use"}. Positive test to: ***. Negative test to: ***.  Results discussed with patient/family.   {Blank single:19197::"Allergy testing results were read and interpreted by myself, documented by clinical staff."," "}  Assessment/Plan   ***  Sigurd Sos, MD  [facility]

## 2021-11-21 ENCOUNTER — Ambulatory Visit (INDEPENDENT_AMBULATORY_CARE_PROVIDER_SITE_OTHER): Payer: Medicare Other | Admitting: Internal Medicine

## 2021-11-21 ENCOUNTER — Ambulatory Visit: Payer: Medicare Other | Admitting: Internal Medicine

## 2021-11-21 ENCOUNTER — Encounter: Payer: Self-pay | Admitting: Internal Medicine

## 2021-11-21 VITALS — BP 114/58 | HR 65 | Temp 98.1°F | Resp 16

## 2021-11-21 DIAGNOSIS — I872 Venous insufficiency (chronic) (peripheral): Secondary | ICD-10-CM

## 2021-11-21 DIAGNOSIS — L209 Atopic dermatitis, unspecified: Secondary | ICD-10-CM | POA: Diagnosis not present

## 2021-11-21 DIAGNOSIS — L281 Prurigo nodularis: Secondary | ICD-10-CM | POA: Diagnosis not present

## 2021-11-21 MED ORDER — AMMONIUM LACTATE 12 % EX LOTN: TOPICAL_LOTION | CUTANEOUS | 1 refills | Status: DC | PRN

## 2021-11-21 NOTE — Patient Instructions (Addendum)
Chronic itching with rash (eczema + prurigo nodularis + stasis dermatitis?):  ?- use compression stockings on your legs to help with the rash on your legs ?- your repeat allergy testing to environmental was negative on 06/27/21 ?- continue clobetasol 0.05% up to twice daily on body- do not use on face, groin or armpits ?- continue Dupixent for suspected eczema and prurigo nodularis  ?- continue moisturization with hypoallergenic ointment at least twice daily-vaseline, cerave, vanicream, aquaphor, aveeno are all great options ?- continue Sarna cream as needed for itching ?- continue hydroxyzine at night as needed for itching, try to avoid daily use ?- continue lac-hydrin lotion as needed for legs ?- continue your follow-up with Dermatology, especially with your history of skin cancer ? ?Follow-up in 6 months, sooner if needed. ? ?It was a pleasure seeing you again in clinic today! ? ? ?Sigurd Sos, MD ?[facility] ? ? ? ? ?

## 2021-11-21 NOTE — Progress Notes (Signed)
? ?FOLLOW UP ?Date of Service/Encounter:  11/21/21 ? ? ?Subjective:  ?[name] (DOB: 02/01/1934) is a 88 y.o. female who [facility] in re-evaluation of the following: dermatitis ?History obtained from: chart review and patient. ? ?For Review, LV was on 08/23/21  with Dr.Cristo Ausburn seen for dermatitis.  She was starting to notice some difference with Dupixent but at that point had only had injection and had not received her home injections. ? ?Today presents for follow-up. ?She feels her skin is very tolerable since starting on Dupixent.  Her last injection was 2 days ago.  She is not having any adverse side effects. ?Very infrequently will use clobetasol.   ?She is using compression stockings on her legs which helps.  Her right leg is somewhat rough, but ammonium lactacte helps.  She is out, dermatology had previously prescribed.  She would like a refill. ?She is using hydroxyzine mostly for her right leg (has stasis dermatitis) to help with itching.  Only about 3 to 4 times per week.   ? ?Allergies as of 11/21/2021   ? ?   Reactions  ? Azithromycin Other (See Comments)  ? Other Reaction: Other reaction ?Other reaction(s): Other (See Comments) ?Other Reaction: Other reaction  ? Gabapentin Other (See Comments)  ? FALLS and makes pts body feels "weird" ?Patient Reports: Falls like Drop Attacks (no warning, just falls to the floor) ?Patient Reports: "It makes my body feel weird"  ? Amoxicillin Itching  ? Codeine   ? Feldene [piroxicam]   ? Liver enzyme elevation  ? Pepcid [famotidine] Other (See Comments)  ? constipation  ? Warfarin   ? Other reaction(s): BRUISING  ? Atorvastatin Itching  ? ?  ? ?  ?Medication List  ?  ? ?  ? Accurate as of Nov 21, 2021  3:14 PM. If you have any questions, ask your nurse or doctor.  ?  ?  ? ?  ? ?amitriptyline 25 MG tablet ?Commonly known as: ELAVIL ?Take by mouth. ?  ?AMLODIPINE BESYLATE PO ?Take 7.5 mg by mouth. ?  ?amLODipine 2.5 MG  tablet ?Commonly known as: NORVASC ?Take 2.5 mg by mouth daily. ?  ?amLODipine 5 MG tablet ?Commonly known as: NORVASC ?Take by mouth. ?  ?ammonium lactate 12 % lotion ?Commonly known as: LAC-HYDRIN ?Apply topically as needed for dry skin. ?  ?cetirizine 10 MG tablet ?Commonly known as: ZYRTEC ?Take 10 mg by mouth daily. ?  ?clobetasol cream 0.05 % ?Commonly known as: TEMOVATE ?Apply 1 application topically 2 (two) times daily as needed. ?  ?DULoxetine 60 MG capsule ?Commonly known as: CYMBALTA ?Take 1 capsule by mouth daily. ?  ?dupilumab 300 MG/2ML prefilled syringe ?Commonly known as: DUPIXENT ?Inject into the skin. ?  ?ezetimibe 10 MG tablet ?Commonly known as: ZETIA ?Take 10 mg by mouth daily. ?  ?losartan 100 MG tablet ?Commonly known as: COZAAR ?Take 1 tablet by mouth daily. ?What changed: Another medication with the same name was removed. Continue taking this medication, and follow the directions you see here. ?Changed by: Tonny Bollman, MD ?  ?metoprolol succinate 25 MG 24 hr tablet ?Commonly known as: TOPROL-XL ?Take 25 mg by mouth daily. ?  ?omeprazole 20 MG capsule ?Commonly known as: PRILOSEC ?Take by mouth. ?  ?Polyethyl Glycol-Propyl Glycol 0.4-0.3 % Soln ?Systane ?  ?Prolia 60 MG/ML Soln injection ?Generic drug: denosumab ?60 mg. ?  ?Synthroid 88 MCG tablet ?Generic drug: levothyroxine ?  ?Synthroid 75 MCG  tablet ?Generic drug: levothyroxine ?Take by mouth. ?  ?traMADol 50 MG tablet ?Commonly known as: ULTRAM ?PLEASE SEE ATTACHED FOR DETAILED DIRECTIONS ?  ? ?  ? ?Past Medical History:  ?Diagnosis Date  ? Arthritis   ? GERD (gastroesophageal reflux disease)   ? Hypertension   ? Pulmonary embolism (HCC)   ? Thyroid disease   ? ?Past Surgical History:  ?Procedure Laterality Date  ? APPENDECTOMY    ? CATARACT EXTRACTION    ? FOOT NEUROMA SURGERY Left 1982  ? FOOT NEUROMA SURGERY Right [id number]  ? FOOT SURGERY    ? IVC FILTER INSERTION Right 2011  ? KNEE ARTHROSCOPY Right 2011  ? REPLACEMENT TOTAL KNEE     ? ROTATOR CUFF REPAIR Left 2003  ? SQUAMOUS CELL CARCINOMA EXCISION    ? 07/2020  ? ?Otherwise, there have been no changes to her past medical history, surgical history, family history, or social history. ? ?ROS: All others negative except as noted per HPI.  ? ?Objective:  ?BP (!) 114/58   Pulse 65   Temp 98.1 ?F (36.7 ?C) (Temporal)   Resp 16   SpO2 98%  ?There is no height or weight on file to calculate BMI. ?Physical Exam: ?General Appearance:  Alert, cooperative, no distress, appears stated age  ?Head:  Normocephalic, without obvious abnormality, atraumatic  ?Eyes:  Conjunctiva clear, EOM's intact  ?Nose: Nares normal, normal mucosa  ?Throat: Lips, tongue normal; teeth and gums normal, normal posterior oropharynx  ?Neck: Supple, symmetrical  ?Lungs:   clear to auscultation bilaterally, Respirations unlabored, no coughing  ?Heart:  regular rate and rhythm and no murmur, Appears well perfused  ?Extremities: No edema  ?Skin: Wearing compression stocking on lower extremities, skin improved on upper extremities from initial visit, no erythematous rashes noted in visible areas of skin  ?Neurologic: No gross deficits  ? ?Assessment/Plan  ?Mrs. Daum has multiple dermatological issues including what appeared to have been atopic dermatitis with possible prurigo nodularis which has improved since starting Dupixent. ?Also has stasis dermatitis which is still an issue in her right leg mostly, but also improving. ?She has a history of squamous cell cancer, and is followed by Dermatology for regular check-ups. ? ?Chronic itching with rash (eczema + prurigo nodularis + stasis dermatitis?): improved since starting Dupixent ?- use compression stockings on your legs to help with the rash on your legs ?- your repeat allergy testing to environmental was negative on 06/27/21 ?- continue clobetasol 0.05% up to twice daily on body- do not use on face, groin or armpits ?- continue Dupixent for suspected eczema and prurigo nodularis   ?- continue moisturization with hypoallergenic ointment at least twice daily-vaseline, cerave, vanicream, aquaphor, aveeno are all great options ?- continue Sarna cream as needed for itching ?- continue hydroxyzine at night as needed for itching, try to avoid daily use ?- continue lac-hydrin lotion as needed for legs ?- continue your follow-up with Dermatology, especially with your history of skin cancer ? ?Follow-up in 6 months, sooner if needed. ? ?Tonny Bollman, MD  ?[facility] ? ? ? ? ? ? ?

## 2021-11-25 ENCOUNTER — Other Ambulatory Visit: Payer: Self-pay

## 2021-11-25 MED ORDER — AMMONIUM LACTATE 12 % EX LOTN: TOPICAL_LOTION | CUTANEOUS | 1 refills | Status: DC | PRN

## 2021-12-03 ENCOUNTER — Ambulatory Visit: Payer: Medicare Other | Admitting: Physical Therapy

## 2021-12-06 ENCOUNTER — Ambulatory Visit: Payer: Medicare Other | Admitting: Physical Therapy

## 2021-12-10 ENCOUNTER — Encounter: Payer: Medicare Other | Admitting: Physical Therapy

## 2021-12-13 ENCOUNTER — Encounter: Payer: Medicare Other | Admitting: Physical Therapy

## 2022-01-28 ENCOUNTER — Telehealth: Payer: Self-pay | Admitting: Neurology

## 2022-01-28 NOTE — Telephone Encounter (Signed)
Pt is calling and wants to talk to a nurse about getting a MRI report. Pt states she talk to someone a couple of week ago and just want to follow-up.

## 2022-01-28 NOTE — Telephone Encounter (Signed)
I spoke with the patient regarding this request. She advised that she already has a copy of the MRI report and is needing a copy of the MRI disc. I let her know that it would be easier for her to call [facility] directly, since that is where the MRI was done. They can either mail her a copy or have a copy ready for pick up at her request. She was given their phone number and she is going to reach out. I advised her to give [place] a call back if she has any trouble getting the disc copy of her imaging.

## 2022-03-19 ENCOUNTER — Other Ambulatory Visit: Payer: Self-pay

## 2022-03-19 ENCOUNTER — Encounter (HOSPITAL_BASED_OUTPATIENT_CLINIC_OR_DEPARTMENT_OTHER): Payer: Self-pay

## 2022-03-19 ENCOUNTER — Emergency Department (HOSPITAL_BASED_OUTPATIENT_CLINIC_OR_DEPARTMENT_OTHER)
Admission: EM | Admit: 2022-03-19 | Discharge: 2022-03-19 | Disposition: A | Discharge status: Home / Self Care | Payer: Medicare Other | Attending: Emergency Medicine | Admitting: Emergency Medicine

## 2022-03-19 ENCOUNTER — Telehealth: Payer: Self-pay | Admitting: Radiology

## 2022-03-19 ENCOUNTER — Emergency Department (HOSPITAL_BASED_OUTPATIENT_CLINIC_OR_DEPARTMENT_OTHER): Payer: Medicare Other

## 2022-03-19 DIAGNOSIS — X509XXA Other and unspecified overexertion or strenuous movements or postures, initial encounter: Secondary | ICD-10-CM | POA: Diagnosis not present

## 2022-03-19 DIAGNOSIS — S83104A Unspecified dislocation of right knee, initial encounter: Secondary | ICD-10-CM | POA: Insufficient documentation

## 2022-03-19 DIAGNOSIS — Z96651 Presence of right artificial knee joint: Secondary | ICD-10-CM | POA: Diagnosis not present

## 2022-03-19 DIAGNOSIS — S8991XA Unspecified injury of right lower leg, initial encounter: Secondary | ICD-10-CM | POA: Diagnosis present

## 2022-03-19 MED ORDER — KETAMINE HCL 10 MG/ML IJ SOLN
0.5000 mg/kg | Freq: Once | INTRAMUSCULAR | Status: AC
  Administered 2022-03-19: 27 mg via INTRAVENOUS
  Filled 2022-03-19: qty 1

## 2022-03-19 MED ORDER — PROPOFOL 10 MG/ML IV BOLUS
0.5000 mg/kg | Freq: Once | INTRAVENOUS | Status: AC
  Administered 2022-03-19: 27.2 mg via INTRAVENOUS
  Filled 2022-03-19: qty 20

## 2022-03-19 MED ORDER — SODIUM CHLORIDE 0.9 % IV BOLUS
1000.0000 mL | Freq: Once | INTRAVENOUS | Status: AC
  Administered 2022-03-19: 1000 mL via INTRAVENOUS

## 2022-03-19 NOTE — ED Provider Notes (Signed)
[facility] EMERGENCY DEPARTMENT Provider Note   CSN: [id number] Arrival date & time: 03/19/22  1041     History  Chief Complaint  Patient presents with   Knee Pain    [name] is a 88 y.o. female.  Patient is an 88 year old female with past medical history of right knee arthroplasty completed approximately 11 years ago by Dr.Whitman presenting for right-sided knee pain.  Patient admits to multiple episodes of right knee dislocation that resulted in significant pain until it popped back into place.  States this morning she dislocated at 5 AM and it has not spontaneously went back into place.  Denies any recent falls or trauma.  Denies any open wounds, swelling, redness.  The history is provided by the patient and a relative. No language interpreter was used.  Knee Pain Associated symptoms: no back pain and no fever        Home Medications Prior to Admission medications   Medication Sig Start Date End Date Taking? Authorizing Provider  amitriptyline (ELAVIL) 25 MG tablet Take by mouth. 02/28/21   [provider]  amLODipine (NORVASC) 2.5 MG tablet Take 2.5 mg by mouth daily. 04/01/21   [provider]  amLODipine (NORVASC) 5 MG tablet Take by mouth. 09/19/21   [provider]  AMLODIPINE BESYLATE PO Take 7.5 mg by mouth.  08/30/13   [provider]  ammonium lactate (LAC-HYDRIN) 12 % lotion Apply topically as needed for dry skin. 11/25/21   Verlee Monte, MD  cetirizine (ZYRTEC) 10 MG tablet Take 10 mg by mouth daily.    [provider]  clobetasol cream (TEMOVATE) 0.05 % Apply 1 application topically 2 (two) times daily as needed. 08/23/21   Verlee Monte, MD  denosumab (PROLIA) 60 MG/ML SOLN injection 60 mg. 06/09/12   [provider]  DULoxetine (CYMBALTA) 60 MG capsule Take 1 capsule by mouth daily. 04/11/21   [provider]  dupilumab (DUPIXENT) 300 MG/2ML prefilled syringe Inject into the skin.     [provider]  ezetimibe (ZETIA) 10 MG tablet Take 10 mg by mouth daily. 05/25/21   [provider]  losartan (COZAAR) 100 MG tablet Take 1 tablet by mouth daily. 09/05/21   [provider]  metoprolol succinate (TOPROL-XL) 25 MG 24 hr tablet Take 25 mg by mouth daily.    [provider]  omeprazole (PRILOSEC) 20 MG capsule Take by mouth.    [provider]  Polyethyl Glycol-Propyl Glycol 0.4-0.3 % SOLN Systane    [provider]  SYNTHROID 75 MCG tablet Take by mouth. 08/22/21   [provider]  SYNTHROID 88 MCG tablet  02/19/18   [provider]  traMADol (ULTRAM) 50 MG tablet PLEASE SEE ATTACHED FOR DETAILED DIRECTIONS Patient not taking: Reported on 11/21/2021 11/13/21   [provider]      Allergies    Azithromycin, Gabapentin, Amoxicillin, Codeine, Feldene [piroxicam], Pepcid [famotidine], Warfarin, and Atorvastatin    Review of Systems   Review of Systems  Constitutional:  Negative for chills and fever.  HENT:  Negative for ear pain and sore throat.   Eyes:  Negative for pain and visual disturbance.  Respiratory:  Negative for cough and shortness of breath.   Cardiovascular:  Negative for chest pain and palpitations.  Gastrointestinal:  Negative for abdominal pain and vomiting.  Genitourinary:  Negative for dysuria and hematuria.  Musculoskeletal:  Negative for arthralgias and back pain.  Skin:  Negative for color change  and rash.  Neurological:  Negative for seizures and syncope.  All other systems reviewed and are negative.   Physical Exam Updated Vital Signs BP (!) 210/103   Pulse 84   Temp 98.5 F (36.9 C) (Oral)   Resp (!) 22   Ht 5\' 6"  (1.676 m)   Wt 54.4 kg   SpO2 100%   BMI 19.37 kg/m  Physical Exam Vitals and nursing note reviewed.  Constitutional:      General: She is not in acute distress.    Appearance: She is well-developed.  HENT:     Head: Normocephalic and atraumatic.   Eyes:     Conjunctiva/sclera: Conjunctivae normal.  Cardiovascular:     Rate and Rhythm: Normal rate and regular rhythm.     Pulses:          Dorsalis pedis pulses are 2+ on the right side and 2+ on the left side.     Heart sounds: No murmur heard. Pulmonary:     Effort: Pulmonary effort is normal. No respiratory distress.     Breath sounds: Normal breath sounds.  Abdominal:     Palpations: Abdomen is soft.     Tenderness: There is no abdominal tenderness.  Musculoskeletal:        General: No swelling.     Cervical back: Neck supple.     Right knee: Bony tenderness present. Abnormal alignment.  Skin:    General: Skin is warm and dry.     Capillary Refill: Capillary refill takes less than 2 seconds.  Neurological:     General: No focal deficit present.     Mental Status: She is alert and oriented to person, place, and time.     GCS: GCS eye subscore is 4. GCS verbal subscore is 5. GCS motor subscore is 6.     Sensory: Sensation is intact.     Motor: Motor function is intact.  Psychiatric:        Mood and Affect: Mood normal.     ED Results / Procedures / Treatments   Labs (all labs ordered are listed, but only abnormal results are displayed) Labs Reviewed - No data to display  EKG None  Radiology DG Knee 2 Views Right  Result Date: 03/19/2022 CLINICAL DATA:  Knee pain since this morning. EXAM: RIGHT KNEE - 1-2 VIEW COMPARISON:  10/31/2021 FINDINGS: Total knee arthroplasty. Internal radiolucent component which has rotated compared to prior based on the internal markers with posterolateral translation of the knee and lateral widening. No acute fracture. IMPRESSION: Malpositioned radiolucent spacer which has rotated since prior with joint deformity. Electronically Signed   By: 11/02/2021 M.D.   On: 03/19/2022 11:43    Procedures .Sedation  Date/Time: 03/19/2022 1:39 PM  Performed by: 03/21/2022, DO Authorized by: Franne Forts, DO   Consent:    Consent  obtained:  Verbal and written   Consent given by:  Patient   Risks discussed:  Dysrhythmia, prolonged sedation necessitating reversal, prolonged hypoxia resulting in organ damage, vomiting and inadequate sedation   Alternatives discussed:  Analgesia without sedation Universal protocol:    Immediately prior to procedure, a time out was called: yes   Indications:    Procedure performed:  Dislocation reduction Pre-sedation assessment:    Time since last food or drink:  4   ASA classification: class 3 - patient with severe systemic disease     Mallampati score:  I - soft palate, uvula, fauces, pillars visible   Pre-sedation  assessments completed and reviewed: airway patency, cardiovascular function, hydration status, mental status, nausea/vomiting, pain level, respiratory function and temperature   Immediate pre-procedure details:    Reviewed: vital signs, relevant labs/tests and NPO status     Verified: bag valve mask available, emergency equipment available, intubation equipment available, IV patency confirmed, oxygen available and suction available   Procedure details (see MAR for exact dosages):    Preoxygenation:  Nasal cannula   Sedation:  Ketamine and propofol   Intended level of sedation: deep   Intra-procedure monitoring:  Cardiac monitor, blood pressure monitoring, continuous capnometry, continuous pulse oximetry, frequent LOC assessments and frequent vital sign checks   Intra-procedure events: none     Total Provider sedation time (minutes):  15 Post-procedure details:    Attendance: Constant attendance by certified staff until patient recovered     Recovery: Patient returned to pre-procedure baseline     Post-sedation assessments completed and reviewed: airway patency, cardiovascular function, hydration status, mental status, nausea/vomiting, pain level, respiratory function and temperature     Patient is stable for discharge or admission: yes     Procedure completion:  Tolerated  well, no immediate complications Reduction of dislocation  Date/Time: 03/19/2022 2:48 PM  Performed by: Franne Forts, DO Authorized by: Franne Forts, DO  Consent: Written consent obtained. Consent given by: patient Patient understanding: patient states understanding of the procedure being performed Patient identity confirmed: verbally with patient, arm band, provided demographic data and hospital-assigned identification number Time out: Immediately prior to procedure a "time out" was called to verify the correct patient, procedure, equipment, support staff and site/side marked as required.  Sedation: Patient sedated: yes Sedatives: propofol Analgesia: ketamine Vitals: Vital signs were monitored during sedation.  Patient tolerance: patient tolerated the procedure well with no immediate complications       Medications Ordered in ED Medications  sodium chloride 0.9 % bolus 1,000 mL (1,000 mLs Intravenous New Bag/Given 03/19/22 1348)  ketamine (KETALAR) injection 27 mg (27 mg Intravenous Given 03/19/22 1354)  propofol (DIPRIVAN) 10 mg/mL bolus/IV push 27.2 mg (27.2 mg Intravenous Given 03/19/22 1354)    ED Course/ Medical Decision Making/ A&P                           Medical Decision Making Amount and/or Complexity of Data Reviewed Radiology: ordered.  Risk Prescription drug management.   81:19 PM  88 year old female with past medical history of right knee arthroplasty completed approximately 11 years ago by Dr.Whitfield presenting for right-sided knee pain.  X-ray demonstrates malalignment of the knee.  I spoke with patient's orthopedic surgeon who has given me the greenlight to attempt a right need reduction.  Procedural sedation completed with ketamine and propofol.  Unsuccessful attempt at right knee reduction.  Patient discharged home in lives here with follow-up instructions to call make an appointment tomorrow morning.  Dr. Cleophas Dunker he will be able to see patient  tomorrow late afternoon.  Leg reamins neurovascularly intact after manipulation and knee immobilizer placement.  Patient in no distress and overall condition improved here in the ED. Detailed discussions were had with the patient regarding current findings, and need for close f/u with PCP or on call doctor. The patient has been instructed to return immediately if the symptoms worsen in any way for re-evaluation. Patient verbalized understanding and is in agreement with current care plan. All questions answered prior to discharge.          Final  Clinical Impression(s) / ED Diagnoses Final diagnoses:  Dislocation of right knee, initial encounter  Status post total right knee replacement    Rx / DC Orders ED Discharge Orders     None         Franne Forts, DO 03/19/22 1448

## 2022-03-19 NOTE — Sedation Documentation (Signed)
Still manipulating leg. Pt not responding to pain.

## 2022-03-19 NOTE — Sedation Documentation (Signed)
Provider giving remaining half of ketamine/propofol mix

## 2022-03-19 NOTE — Sedation Documentation (Signed)
Pt not responding to verbal stimulation. Provider manipulating leg.

## 2022-03-19 NOTE — Sedation Documentation (Signed)
Manipulating leg.

## 2022-03-19 NOTE — Sedation Documentation (Signed)
Manipulating leg.  

## 2022-03-19 NOTE — Sedation Documentation (Signed)
Provider pushed half of ketamine/propofol mix.

## 2022-03-19 NOTE — Telephone Encounter (Signed)
Per Susan--Dr. Wallace Cullens at [facility] is requesting a call back regarding patient- she has had a knee injury. The call back number for Susan/Dr. Wallace Cullens is [phone]

## 2022-03-19 NOTE — Sedation Documentation (Signed)
2.7mg  ketamine and 2.72mg  propofol pulled up into same syringe for provider to give.

## 2022-03-19 NOTE — ED Notes (Signed)
Pt family was wanting [facility] to take pt home.  Carrie RN called [facility] and they explained requirements, these requirements  were then explained to family and they agreed to take pt in car.

## 2022-03-19 NOTE — Sedation Documentation (Signed)
Finished manipulating leg. Knee immobilizer applied

## 2022-03-19 NOTE — Discharge Instructions (Signed)
Please follow-up with your established orthopedic surgeon first thing tomorrow morning for appointment time.

## 2022-03-19 NOTE — Telephone Encounter (Signed)
called

## 2022-03-19 NOTE — ED Triage Notes (Signed)
Pt had knee disclocation while sleeping. Has had replacement in 2012. Dislocation is normal for pt but has not been able to put it back in place this time herself. PA told her to come here  154/90 96 P 80  Pain 3/4

## 2022-03-19 NOTE — Sedation Documentation (Signed)
Unable to rate pain during the procedure due to sedation 

## 2022-03-20 ENCOUNTER — Ambulatory Visit (INDEPENDENT_AMBULATORY_CARE_PROVIDER_SITE_OTHER): Payer: Medicare Other | Admitting: Orthopaedic Surgery

## 2022-03-20 ENCOUNTER — Encounter: Payer: Self-pay | Admitting: Orthopaedic Surgery

## 2022-03-20 DIAGNOSIS — T8484XD Pain due to internal orthopedic prosthetic devices, implants and grafts, subsequent encounter: Secondary | ICD-10-CM

## 2022-03-20 DIAGNOSIS — Z96651 Presence of right artificial knee joint: Secondary | ICD-10-CM | POA: Diagnosis not present

## 2022-03-20 NOTE — Progress Notes (Signed)
Office Visit Note   Patient: [name]           Date of Birth: 05/09/1934           MRN: [id number] Visit Date: 03/20/2022              Requested by: Jeralyn Bennett, MD [address] [place],  [place] [address] PCP: Jeralyn Bennett, MD   Assessment & Plan: Visit Diagnoses:  1. Pain due to total right knee replacement, subsequent encounter     Plan: [name] is 88 years old and is about 11 years status post uncomplicated primary right total knee replacement.  She did well until April of this year when she is started to experience some sensation of "something happening in my knee".  Yesterday morning she awoke and was unable to fully extend her knee.  She was seen in the emergency room with x-rays demonstrating what appeared to be malrotation of the polyethylene component.  An attempt was made by the ER physician to reduce this but was unsuccessful player and she was placed in a knee immobilizer and comes to the office with her husband and daughter today.  X-rays were reviewed and there does appear to be at least 90 degree rotation of the polyethylene component.  This could not be reduced in the office either.  Long discussion with the family regarding treatment options.  She is otherwise active and wants to remain so.  I think the only option would be to open the knee and replaced the polyethylene component.  I think the metallic components are intact without evidence of loosening.  There was no knee effusion the knee was not hot warm or red.  I do think she has had some laxity of the medial lateral collateral ligaments have developed over time.  Questions were answered.  I have discussed this with Dr. Magnus Ivan who will plan to perform the surgery on Tuesday.  She is in a knee immobilizer and does have tramadol for pain at home.  Follow-Up Instructions: Return We will schedule surgical revision of polyethylene component right total knee replacement.   Orders:  No orders of the  defined types were placed in this encounter.  No orders of the defined types were placed in this encounter.     Procedures: No procedures performed   Clinical Data: No additional findings.   Subjective: Chief Complaint  Patient presents with   Right Knee - Follow-up   Patient presents today or right knee dislocation. She states that her right knee has been dislocating and she states that she has previously been able to put her knee back in place however was not able to this time. She is currently noticing that her knee is dislocating more while she is sleeping. At this time she is currently wearing a knee immobilizer to help with stability.    Review of Systems   Objective: Vital Signs: There were no vitals taken for this visit.  Physical Exam Constitutional:      Appearance: She is well-developed.  Pulmonary:     Effort: Pulmonary effort is normal.  Skin:    General: Skin is warm and dry.  Neurological:     Mental Status: She is alert and oriented to person, place, and time.  Psychiatric:        Behavior: Behavior normal.     Ortho Exam awake alert and oriented x3.  Comfortable sitting.  Evaluated in a wheelchair.  The knee immobilizer was removed.  Incomplete extension of the right knee by about 25 degrees.  I think I can feel the polyethylene component prominent along the medial aspect of the knee there is no effusion.  There is some opening medially with a valgus stress.  Has some sensory loss in her foot on a chronic basis but nothing new since the onset of her knee pain yesterday.  Good capillary refill and no edema.  Specialty Comments:  No specialty comments available.  Imaging: DG Knee 2 Views Right  Result Date: 03/19/2022 CLINICAL DATA:  Knee pain since this morning. EXAM: RIGHT KNEE - 1-2 VIEW COMPARISON:  10/31/2021 FINDINGS: Total knee arthroplasty. Internal radiolucent component which has rotated compared to prior based on the internal markers with  posterolateral translation of the knee and lateral widening. No acute fracture. IMPRESSION: Malpositioned radiolucent spacer which has rotated since prior with joint deformity. Electronically Signed   By: Tiburcio Pea M.D.   On: 03/19/2022 11:43     PMFS History: Patient Active Problem List   Diagnosis Date Noted   Stasis dermatitis of both legs 11/21/2021   Painful total knee replacement, right (HCC) 10/31/2021   Prurigo nodularis 06/24/2021   Other specified hypothyroidism 12/21/2018   Chronic urticaria 12/21/2018   Intrinsic atopic dermatitis 12/07/2018   Allergic urticaria 12/07/2018   Gastroesophageal reflux disease without esophagitis 12/07/2018   Age-related osteoporosis without current pathological fracture 12/07/2018   Essential hypertension 12/07/2018   Allergic rhinitis due to animal hair and dander 12/07/2018   Radiculopathy due to lumbar intervertebral disc disorder 05/31/2018   Radiculopathy of cervical region 05/31/2018   Peripheral neuropathy 05/21/2018   Multifactorial gait disorder 04/26/2018   Right foot drop 04/26/2018   Cerebellar ataxia in diseases classified elsewhere (HCC) 04/26/2018   Sensorineural hearing loss (SNHL) of right ear 04/26/2018   Tinnitus aurium, right 04/26/2018   Past Medical History:  Diagnosis Date   Arthritis    GERD (gastroesophageal reflux disease)    Hypertension    Pulmonary embolism (HCC)    Thyroid disease     Family History  Problem Relation Age of Onset   Asthma Sister    Asthma Brother    Allergic rhinitis Neg Hx    Angioedema Neg Hx    Eczema Neg Hx    Immunodeficiency Neg Hx    Urticaria Neg Hx     Past Surgical History:  Procedure Laterality Date   APPENDECTOMY     CATARACT EXTRACTION     FOOT NEUROMA SURGERY Left 1982   FOOT NEUROMA SURGERY Right [phone]   FOOT SURGERY     IVC FILTER INSERTION Right 2011   KNEE ARTHROSCOPY Right 2011   REPLACEMENT TOTAL KNEE     ROTATOR CUFF REPAIR Left 2003    SQUAMOUS CELL CARCINOMA EXCISION     07/2020   Social History   Occupational History   Not on file  Tobacco Use   Smoking status: Never   Smokeless tobacco: Never  Vaping Use   Vaping Use: Never used  Substance and Sexual Activity   Alcohol use: No   Drug use: Never   Sexual activity: Not on file

## 2022-03-20 NOTE — H&P (Signed)
[name] is an 88 y.o. female.   Chief Complaint: Right Knee Replacement Dislocation HPI: Patient presents today or right knee dislocation. She states that her right knee has been dislocating and she states that she has previously been able to put her knee back in place however was not able to this time. She is currently noticing that her knee is dislocating more while she is sleeping. At this time she is currently wearing a knee immobilizer to help with stability.    Past Medical History:  Diagnosis Date   Arthritis    GERD (gastroesophageal reflux disease)    Hypertension    Pulmonary embolism (HCC)    Thyroid disease     Past Surgical History:  Procedure Laterality Date   APPENDECTOMY     CATARACT EXTRACTION     FOOT NEUROMA SURGERY Left 1982   FOOT NEUROMA SURGERY Right [phone]   FOOT SURGERY     IVC FILTER INSERTION Right 2011   KNEE ARTHROSCOPY Right 2011   REPLACEMENT TOTAL KNEE     ROTATOR CUFF REPAIR Left 2003   SQUAMOUS CELL CARCINOMA EXCISION     07/2020    Family History  Problem Relation Age of Onset   Asthma Sister    Asthma Brother    Allergic rhinitis Neg Hx    Angioedema Neg Hx    Eczema Neg Hx    Immunodeficiency Neg Hx    Urticaria Neg Hx    Social History:  reports that she has never smoked. She has never used smokeless tobacco. She reports that she does not drink alcohol and does not use drugs.  Allergies:  Allergies  Allergen Reactions   Azithromycin Other (See Comments)    Other Reaction: Other reaction Other reaction(s): Other (See Comments) Other Reaction: Other reaction    Gabapentin Other (See Comments)    FALLS and makes pts body feels "weird" Patient Reports: Falls like Drop Attacks (no warning, just falls to the floor) Patient Reports: "It makes my body feel weird"    Amoxicillin Itching   Codeine    Feldene [Piroxicam]     Liver enzyme elevation   Pepcid [Famotidine] Other (See Comments)    constipation   Warfarin      Other reaction(s): BRUISING   Atorvastatin Itching    (Not in a hospital admission)   No results found for this or any previous visit (from the past 48 hour(s)). DG Knee 2 Views Right  Result Date: 03/19/2022 CLINICAL DATA:  Knee pain since this morning. EXAM: RIGHT KNEE - 1-2 VIEW COMPARISON:  10/31/2021 FINDINGS: Total knee arthroplasty. Internal radiolucent component which has rotated compared to prior based on the internal markers with posterolateral translation of the knee and lateral widening. No acute fracture. IMPRESSION: Malpositioned radiolucent spacer which has rotated since prior with joint deformity. Electronically Signed   By: Tiburcio Pea M.D.   On: 03/19/2022 11:43    Review of Systems  All other systems reviewed and are negative.   There were no vitals taken for this visit. Physical Exam  Constitutional:      Appearance: She is well-developed.  Pulmonary:     Effort: Pulmonary effort is normal.  Skin:    General: Skin is warm and dry.  Neurological:     Mental Status: She is alert and oriented to person, place, and time.  Psychiatric:     Heart RRR Lungs : Clear  Ortho Exam   Evaluated in a wheelchair.  The knee immobilizer  was removed.  Incomplete extension of the right knee by about 25 degrees.  I think I can feel the polyethylene component prominent along the medial aspect of the knee there is no effusion.  There is some opening medially with a valgus stress.  Has some sensory loss in her foot on a chronic basis but nothing new since the onset of her knee pain yesterday.  Good capillary refill and no edema. Assessment/Plan  Plan: [name] is 88 years old and is about 11 years status post uncomplicated primary right total knee replacement.  She did well until April of this year when she is started to experience some sensation of "something happening in my knee".  Yesterday morning she awoke and was unable to fully extend her knee.  She was seen in the emergency  room with x-rays demonstrating what appeared to be malrotation of the polyethylene component.  An attempt was made by the ER physician to reduce this but was unsuccessful player and she was placed in a knee immobilizer and comes to the office with her husband and daughter today.  X-rays were reviewed and there does appear to be at least 90 degree rotation of the polyethylene component.  This could not be reduced in the office either.  Long discussion with the family regarding treatment options.  She is otherwise active and wants to remain so.  I think the only option would be to open the knee and replaced the polyethylene component.  I think the metallic components are intact without evidence of loosening.  There was no knee effusion the knee was not hot warm or red.  I do think she has had some laxity of the medial lateral collateral ligaments have developed over time.  Questions were answered.  I have discussed this with Dr. Magnus Ivan who will plan to perform the surgery on Tuesday.  She is in a knee immobilizer and does have tramadol for pain at home. [place] Henry Demeritt, PA 03/20/2022, 12:20 PM

## 2022-03-21 ENCOUNTER — Other Ambulatory Visit: Payer: Self-pay | Admitting: Physician Assistant

## 2022-03-21 ENCOUNTER — Encounter (HOSPITAL_COMMUNITY): Payer: Self-pay | Admitting: Orthopaedic Surgery

## 2022-03-21 DIAGNOSIS — Z96659 Presence of unspecified artificial knee joint: Secondary | ICD-10-CM

## 2022-03-21 NOTE — Progress Notes (Signed)
PCP - Dr Jeralyn Bennett Cardiologist - n/a Internal Med - Dr Mathis Bud  Chest x-ray - n/a EKG - DOS Stress Test - n/a ECHO - n/a Cardiac Cath - n/a  ICD Pacemaker/Loop - n/a  Sleep Study -  n/a CPAP - none  ERAS: Clear liquids til 11:30 AM DOS  Anesthesia review: Yes  STOP now taking any Aspirin (unless otherwise instructed by your surgeon), Aleve, Naproxen, Ibuprofen, Motrin, Advil, Goody's, BC's, all herbal medications, fish oil, and all vitamins.   Coronavirus Screening Do you have any of the following symptoms:  Cough yes/no: No Fever (>100.35F)  yes/no: No Runny nose yes/no: No Sore throat yes/no: No Difficulty breathing/shortness of breath  yes/no: No  Have you traveled in the last 14 days and where? yes/no: No  Patient verbalized understanding of instructions that were given via phone.

## 2022-03-21 NOTE — Progress Notes (Signed)
Anesthesia Chart Review: SAME DAY WORK-UP  Case: [id number] Date/Time: 03/25/22 1415   Procedure: RIGHT TOTAL KNEE POLY LINER EXCHANGE VS. TOTAL KNEE REVISION (Right: Knee)   Anesthesia type: Spinal   Pre-op diagnosis: dislocation polyethylene component right total knee arthroplasty   Location: [facility] ROOM 07 / [facility]   Surgeons: Kathryne Hitch, MD       DISCUSSION: Patient is an 88 year old female scheduled for the above procedure. S/p right TKA 09/24/10. Over the years she has had episodes of right knee dislocation that spontaneously popped back into place, but on 03/19/22, her knee did not go back into place so presented to [facility] ED. Provider unable to reduce in ED. X-ray revealed "at least 90 degree rotation of the polyethylene component".  Attempt at reduction in the orthopedic office was also unsuccessful on 03/20/22 and above procedure recommended.  In the interim she will be managed with a knee immobilizer and tramadol for pain  History includes never smoker, HTN, hypothyroidism, pre-diabetes, GERD, PE/DVT (post-op, s/p IVC filter 08/2009), right hearing loss, skin cancer (SCC excision), osteoarthritis (right TKA 09/24/10).   She is a same day work-up, so anesthesia team to evaluate on the day of surgery.    VS: Ht 5\' 5"  (1.651 m)   Wt 54.4 kg   BMI 19.97 kg/m  BP Readings from Last 3 Encounters:  03/19/22 (!) 178/82  11/21/21 (!) 114/58  08/23/21 130/64   Pulse Readings from Last 3 Encounters:  03/19/22 76  11/21/21 65  08/23/21 70     PROVIDERS: 10/21/21, MD is PCP    LABS: For day of surgery. Comparison labs from 01/06/22 ([facility]) showed a normal CMP, H/H 11.1/32.6, PLT 336. A1c 5.6% on 12/31/11 ([facility]).   IMAGES: MRI L-spine 09/17/21 ([facility]): IMPRESSION:  - There is a 44 degrees levoscoliosis of the lumbar spine centered at the L1-L2 level, with severe lumbar spondylosis.  - There is severe spinal stenosis at L3-4, L4-5 and L5-S1    EKG: None  available for review.   CV: N/A  Past Medical History:  Diagnosis Date   Anxiety    Arthritis    Depression    GERD (gastroesophageal reflux disease)    Hearing loss of right ear    Asymmetrical   Hypertension    Hypothyroidism    Pre-diabetes    Pulmonary embolism (HCC)     Past Surgical History:  Procedure Laterality Date   APPENDECTOMY     CATARACT EXTRACTION     FOOT NEUROMA SURGERY Left 1982   FOOT NEUROMA SURGERY Right [phone]   FOOT SURGERY     IVC FILTER INSERTION Right 2011   KNEE ARTHROSCOPY Right 2011   REPLACEMENT TOTAL KNEE     ROTATOR CUFF REPAIR Left 2003   SQUAMOUS CELL CARCINOMA EXCISION     07/2020    MEDICATIONS: No current facility-administered medications for this encounter.    acetaminophen (TYLENOL) 650 MG CR tablet   amLODipine (NORVASC) 2.5 MG tablet   amLODipine (NORVASC) 5 MG tablet   ammonium lactate (LAC-HYDRIN) 12 % lotion   clobetasol cream (TEMOVATE) 0.05 %   denosumab (PROLIA) 60 MG/ML SOLN injection   DULoxetine (CYMBALTA) 60 MG capsule   dupilumab (DUPIXENT) 300 MG/2ML prefilled syringe   ezetimibe (ZETIA) 10 MG tablet   Ferrous Sulfate 28 MG TABS   losartan (COZAAR) 100 MG tablet   metoprolol succinate (TOPROL-XL) 25 MG 24 hr tablet   Multiple Vitamins-Minerals (PRESERVISION AREDS 2+MULTI VIT  PO)   omeprazole (PRILOSEC) 20 MG capsule   Polyethyl Glycol-Propyl Glycol 0.4-0.3 % SOLN   SYNTHROID 75 MCG tablet   SYNTHROID 88 MCG tablet   traMADol (ULTRAM) 50 MG tablet   amitriptyline (ELAVIL) 25 MG tablet   AMLODIPINE BESYLATE PO   cetirizine (ZYRTEC) 10 MG tablet     Shonna Chock, PA-C Surgical Short Stay/Anesthesiology [facility] Phone [phone] [facility] Phone [phone] 03/21/2022 2:55 PM

## 2022-03-21 NOTE — Anesthesia Preprocedure Evaluation (Signed)
Anesthesia Evaluation  Patient identified by MRN, date of birth, ID band Patient awake    Reviewed: Allergy & Precautions, NPO status , Patient's Chart, lab work & pertinent test results, reviewed documented beta blocker date and time   Airway Mallampati: II  TM Distance: >3 FB Neck ROM: Full    Dental no notable dental hx.    Pulmonary PE (hx IVC filter 2011)   Pulmonary exam normal        Cardiovascular hypertension (161/77 in preop), Pt. on medications and Pt. on home beta blockers  Rhythm:Regular Rate:Normal     Neuro/Psych PSYCHIATRIC DISORDERS Anxiety Depression negative neurological ROS     GI/Hepatic Neg liver ROS, GERD  ,  Endo/Other  diabetes (pre-diabetic)Hypothyroidism   Renal/GU negative Renal ROS  negative genitourinary   Musculoskeletal  (+) Arthritis , Osteoarthritis,  Displacement of polyethylene component of R TKR (2012)   MRI L-spine 09/17/21 ([facility]): IMPRESSION:  - There is a 44 degrees levoscoliosis of the lumbar spine centered at the L1-L2 level, with severe lumbar spondylosis.  - There is severe spinal stenosis at L3-4, L4-5 and L5-S1    Abdominal Normal abdominal exam  (+)   Peds negative pediatric ROS (+)  Hematology negative hematology ROS (+)   Anesthesia Other Findings   Reproductive/Obstetrics negative OB ROS                           Anesthesia Physical Anesthesia Plan  ASA: 3  Anesthesia Plan: Spinal, Regional and MAC   Post-op Pain Management: Regional block* and Tylenol PO (pre-op)*   Induction:   PONV Risk Score and Plan: 2 and Propofol infusion and TIVA  Airway Management Planned: Natural Airway and Nasal Cannula  Additional Equipment: None  Intra-op Plan:   Post-operative Plan:   Informed Consent: I have reviewed the patients History and Physical, chart, labs and discussed the procedure including the risks, benefits and alternatives  for the proposed anesthesia with the patient or authorized representative who has indicated his/her understanding and acceptance.       Plan Discussed with: CRNA  Anesthesia Plan Comments: ( )       Anesthesia Quick Evaluation

## 2022-03-25 ENCOUNTER — Encounter (HOSPITAL_COMMUNITY)
Admission: RE | Disposition: A | Discharge status: Skilled Nursing Facility | Payer: Self-pay | Source: Home / Self Care | Attending: Orthopaedic Surgery

## 2022-03-25 ENCOUNTER — Inpatient Hospital Stay (HOSPITAL_COMMUNITY): Payer: Medicare Other

## 2022-03-25 ENCOUNTER — Inpatient Hospital Stay (HOSPITAL_COMMUNITY): Payer: Medicare Other | Admitting: Vascular Surgery

## 2022-03-25 ENCOUNTER — Inpatient Hospital Stay (HOSPITAL_COMMUNITY)
Admission: RE | Admit: 2022-03-25 | Discharge: 2022-03-28 | DRG: 465 | Disposition: A | Discharge status: Skilled Nursing Facility | Payer: Medicare Other | Attending: Orthopaedic Surgery | Admitting: Orthopaedic Surgery

## 2022-03-25 ENCOUNTER — Other Ambulatory Visit: Payer: Self-pay

## 2022-03-25 DIAGNOSIS — T84022A Instability of internal right knee prosthesis, initial encounter: Principal | ICD-10-CM | POA: Diagnosis present

## 2022-03-25 DIAGNOSIS — I10 Essential (primary) hypertension: Secondary | ICD-10-CM

## 2022-03-25 DIAGNOSIS — Z880 Allergy status to penicillin: Secondary | ICD-10-CM

## 2022-03-25 DIAGNOSIS — R7303 Prediabetes: Secondary | ICD-10-CM | POA: Diagnosis present

## 2022-03-25 DIAGNOSIS — T84012D Broken internal right knee prosthesis, subsequent encounter: Principal | ICD-10-CM

## 2022-03-25 DIAGNOSIS — Z825 Family history of asthma and other chronic lower respiratory diseases: Secondary | ICD-10-CM | POA: Diagnosis not present

## 2022-03-25 DIAGNOSIS — Z79890 Hormone replacement therapy (postmenopausal): Secondary | ICD-10-CM

## 2022-03-25 DIAGNOSIS — F418 Other specified anxiety disorders: Secondary | ICD-10-CM | POA: Diagnosis not present

## 2022-03-25 DIAGNOSIS — E039 Hypothyroidism, unspecified: Secondary | ICD-10-CM | POA: Diagnosis present

## 2022-03-25 DIAGNOSIS — T84028A Dislocation of other internal joint prosthesis, initial encounter: Secondary | ICD-10-CM | POA: Diagnosis not present

## 2022-03-25 DIAGNOSIS — Z96651 Presence of right artificial knee joint: Secondary | ICD-10-CM | POA: Diagnosis not present

## 2022-03-25 DIAGNOSIS — Z885 Allergy status to narcotic agent status: Secondary | ICD-10-CM | POA: Diagnosis not present

## 2022-03-25 DIAGNOSIS — F32A Depression, unspecified: Secondary | ICD-10-CM | POA: Diagnosis present

## 2022-03-25 DIAGNOSIS — M25461 Effusion, right knee: Secondary | ICD-10-CM | POA: Diagnosis present

## 2022-03-25 DIAGNOSIS — H9191 Unspecified hearing loss, right ear: Secondary | ICD-10-CM | POA: Diagnosis present

## 2022-03-25 DIAGNOSIS — Z888 Allergy status to other drugs, medicaments and biological substances status: Secondary | ICD-10-CM | POA: Diagnosis not present

## 2022-03-25 DIAGNOSIS — K219 Gastro-esophageal reflux disease without esophagitis: Secondary | ICD-10-CM | POA: Diagnosis present

## 2022-03-25 DIAGNOSIS — Z85828 Personal history of other malignant neoplasm of skin: Secondary | ICD-10-CM | POA: Diagnosis not present

## 2022-03-25 DIAGNOSIS — Y792 Prosthetic and other implants, materials and accessory orthopedic devices associated with adverse incidents: Secondary | ICD-10-CM | POA: Diagnosis present

## 2022-03-25 DIAGNOSIS — M419 Scoliosis, unspecified: Secondary | ICD-10-CM | POA: Diagnosis present

## 2022-03-25 DIAGNOSIS — Z96659 Presence of unspecified artificial knee joint: Secondary | ICD-10-CM

## 2022-03-25 DIAGNOSIS — M48061 Spinal stenosis, lumbar region without neurogenic claudication: Secondary | ICD-10-CM | POA: Diagnosis present

## 2022-03-25 DIAGNOSIS — F419 Anxiety disorder, unspecified: Secondary | ICD-10-CM | POA: Diagnosis present

## 2022-03-25 DIAGNOSIS — Z86711 Personal history of pulmonary embolism: Secondary | ICD-10-CM | POA: Diagnosis not present

## 2022-03-25 DIAGNOSIS — T84018A Broken internal joint prosthesis, other site, initial encounter: Secondary | ICD-10-CM

## 2022-03-25 DIAGNOSIS — M1990 Unspecified osteoarthritis, unspecified site: Secondary | ICD-10-CM | POA: Diagnosis present

## 2022-03-25 HISTORY — DX: Prediabetes: R73.03

## 2022-03-25 HISTORY — PX: TOTAL KNEE REVISION: SHX996

## 2022-03-25 HISTORY — DX: Anxiety disorder, unspecified: F41.9

## 2022-03-25 HISTORY — DX: Unspecified hearing loss, right ear: H91.91

## 2022-03-25 HISTORY — DX: Depression, unspecified: F32.A

## 2022-03-25 HISTORY — DX: Hypothyroidism, unspecified: E03.9

## 2022-03-25 LAB — CBC
HCT: 37.1 % (ref 36.0–46.0)
Hemoglobin: 12.4 g/dL (ref 12.0–15.0)
MCH: 30.8 pg (ref 26.0–34.0)
MCHC: 33.4 g/dL (ref 30.0–36.0)
MCV: 92.3 fL (ref 80.0–100.0)
Platelets: 339 10*3/uL (ref 150–400)
RBC: 4.02 MIL/uL (ref 3.87–5.11)
RDW: 13.4 % (ref 11.5–15.5)
WBC: 8 10*3/uL (ref 4.0–10.5)
nRBC: 0 % (ref 0.0–0.2)

## 2022-03-25 LAB — BASIC METABOLIC PANEL
Anion gap: 9 (ref 5–15)
BUN: 16 mg/dL (ref 8–23)
CO2: 24 mmol/L (ref 22–32)
Calcium: 10.2 mg/dL (ref 8.9–10.3)
Chloride: 105 mmol/L (ref 98–111)
Creatinine, Ser: 0.69 mg/dL (ref 0.44–1.00)
GFR, Estimated: >60 mL/min (ref >60)
Glucose, Bld: 95 mg/dL (ref 70–99)
Potassium: 3.6 mmol/L (ref 3.5–5.1)
Sodium: 138 mmol/L (ref 135–145)

## 2022-03-25 LAB — SURGICAL PCR SCREEN
MRSA, PCR: NEGATIVE
Staphylococcus aureus: POSITIVE — AB

## 2022-03-25 LAB — TYPE AND SCREEN
ABO/RH(D): A POS
Antibody Screen: NEGATIVE

## 2022-03-25 SURGERY — TOTAL KNEE REVISION
Anesthesia: Regional | Site: Knee | Laterality: Right

## 2022-03-25 MED ORDER — FENTANYL CITRATE PF 50 MCG/ML IJ SOSY
50.0000 ug | PREFILLED_SYRINGE | Freq: Once | INTRAMUSCULAR | Status: AC
  Administered 2022-03-25: 50 ug via INTRAVENOUS

## 2022-03-25 MED ORDER — AMLODIPINE BESYLATE 5 MG PO TABS
5.0000 mg | ORAL_TABLET | Freq: Every day | ORAL | Status: DC
  Administered 2022-03-26 – 2022-03-28 (×3): 5 mg via ORAL
  Filled 2022-03-25 (×3): qty 1

## 2022-03-25 MED ORDER — ONDANSETRON HCL 4 MG PO TABS: 4.0000 mg | ORAL_TABLET | Freq: Four times a day (QID) | ORAL | Status: DC | PRN

## 2022-03-25 MED ORDER — SODIUM CHLORIDE 0.9 % IR SOLN
Status: DC | PRN
  Administered 2022-03-25: 1000 mL

## 2022-03-25 MED ORDER — TRAMADOL HCL 50 MG PO TABS
50.0000 mg | ORAL_TABLET | Freq: Four times a day (QID) | ORAL | Status: DC | PRN
  Administered 2022-03-25: 50 mg via ORAL
  Administered 2022-03-26: 100 mg via ORAL
  Administered 2022-03-26: 50 mg via ORAL
  Administered 2022-03-26: 100 mg via ORAL
  Administered 2022-03-27 (×2): 50 mg via ORAL
  Administered 2022-03-28: 100 mg via ORAL
  Filled 2022-03-25 (×3): qty 2
  Filled 2022-03-25 (×2): qty 1
  Filled 2022-03-25: qty 2

## 2022-03-25 MED ORDER — PROPOFOL 10 MG/ML IV BOLUS
INTRAVENOUS | Status: AC
  Filled 2022-03-25: qty 20

## 2022-03-25 MED ORDER — HYDRALAZINE HCL 20 MG/ML IJ SOLN
10.0000 mg | Freq: Once | INTRAMUSCULAR | Status: AC
  Administered 2022-03-25: 10 mg via INTRAVENOUS

## 2022-03-25 MED ORDER — FENTANYL CITRATE (PF) 250 MCG/5ML IJ SOLN
INTRAMUSCULAR | Status: AC
  Filled 2022-03-25: qty 5

## 2022-03-25 MED ORDER — TRANEXAMIC ACID-NACL 1000-0.7 MG/100ML-% IV SOLN
1000.0000 mg | INTRAVENOUS | Status: AC
  Administered 2022-03-25: 1000 mg via INTRAVENOUS
  Filled 2022-03-25: qty 100

## 2022-03-25 MED ORDER — CHLORHEXIDINE GLUCONATE CLOTH 2 % EX PADS: 6.0000 | MEDICATED_PAD | Freq: Every day | CUTANEOUS | Status: DC

## 2022-03-25 MED ORDER — APIXABAN 2.5 MG PO TABS
2.5000 mg | ORAL_TABLET | Freq: Two times a day (BID) | ORAL | Status: DC
  Administered 2022-03-26 – 2022-03-28 (×5): 2.5 mg via ORAL
  Filled 2022-03-25 (×5): qty 1

## 2022-03-25 MED ORDER — MORPHINE SULFATE (PF) 2 MG/ML IV SOLN: 0.5000 mg | INTRAVENOUS | Status: DC | PRN

## 2022-03-25 MED ORDER — AMLODIPINE BESYLATE 2.5 MG PO TABS
2.5000 mg | ORAL_TABLET | Freq: Every day | ORAL | Status: DC
  Administered 2022-03-26 – 2022-03-28 (×3): 2.5 mg via ORAL
  Filled 2022-03-25 (×2): qty 1

## 2022-03-25 MED ORDER — DOCUSATE SODIUM 100 MG PO CAPS
100.0000 mg | ORAL_CAPSULE | Freq: Two times a day (BID) | ORAL | Status: DC
  Administered 2022-03-25 – 2022-03-28 (×6): 100 mg via ORAL
  Filled 2022-03-25 (×6): qty 1

## 2022-03-25 MED ORDER — DIPHENHYDRAMINE HCL 12.5 MG/5ML PO ELIX: 12.5000 mg | ORAL_SOLUTION | ORAL | Status: DC | PRN

## 2022-03-25 MED ORDER — CEFAZOLIN SODIUM-DEXTROSE 1-4 GM/50ML-% IV SOLN: 1.0000 g | Freq: Four times a day (QID) | INTRAVENOUS | Status: DC

## 2022-03-25 MED ORDER — ACETAMINOPHEN ER 650 MG PO TBCR: 1300.0000 mg | EXTENDED_RELEASE_TABLET | Freq: Four times a day (QID) | ORAL | Status: DC | PRN

## 2022-03-25 MED ORDER — HYDROCODONE-ACETAMINOPHEN 5-325 MG PO TABS
1.0000 | ORAL_TABLET | ORAL | Status: DC | PRN
  Administered 2022-03-25 – 2022-03-26 (×2): 2 via ORAL
  Filled 2022-03-25 (×2): qty 2

## 2022-03-25 MED ORDER — PHENYLEPHRINE HCL-NACL 20-0.9 MG/250ML-% IV SOLN
INTRAVENOUS | Status: DC | PRN
  Administered 2022-03-25: 20 ug/min via INTRAVENOUS

## 2022-03-25 MED ORDER — ACETAMINOPHEN 500 MG PO TABS
1000.0000 mg | ORAL_TABLET | Freq: Four times a day (QID) | ORAL | Status: DC | PRN
Start: 2022-03-25 — End: 2022-03-28

## 2022-03-25 MED ORDER — ACETAMINOPHEN 500 MG PO TABS
1000.0000 mg | ORAL_TABLET | Freq: Once | ORAL | Status: AC
Start: 2022-03-25 — End: 2022-03-25
  Administered 2022-03-25: 1000 mg via ORAL
  Filled 2022-03-25: qty 2

## 2022-03-25 MED ORDER — DEXAMETHASONE SODIUM PHOSPHATE 10 MG/ML IJ SOLN
INTRAMUSCULAR | Status: DC | PRN
  Administered 2022-03-25: 10 mg

## 2022-03-25 MED ORDER — MUPIROCIN 2 % EX OINT
1.0000 | TOPICAL_OINTMENT | Freq: Two times a day (BID) | CUTANEOUS | Status: DC
  Administered 2022-03-25 – 2022-03-28 (×6): 1 via NASAL
  Filled 2022-03-25 (×2): qty 22

## 2022-03-25 MED ORDER — METOCLOPRAMIDE HCL 5 MG PO TABS: 5.0000 mg | ORAL_TABLET | Freq: Three times a day (TID) | ORAL | Status: DC | PRN

## 2022-03-25 MED ORDER — LEVOTHYROXINE SODIUM 75 MCG PO TABS
75.0000 ug | ORAL_TABLET | ORAL | Status: DC
  Administered 2022-03-26 – 2022-03-28 (×2): 75 ug via ORAL
  Filled 2022-03-25 (×2): qty 1

## 2022-03-25 MED ORDER — PHENOL 1.4 % MT LIQD
1.0000 | OROMUCOSAL | Status: DC | PRN
Start: 2022-03-25 — End: 2022-03-28

## 2022-03-25 MED ORDER — DULOXETINE HCL 60 MG PO CPEP
60.0000 mg | ORAL_CAPSULE | Freq: Every day | ORAL | Status: DC
  Administered 2022-03-25 – 2022-03-27 (×3): 60 mg via ORAL
  Filled 2022-03-25 (×3): qty 1

## 2022-03-25 MED ORDER — PANTOPRAZOLE SODIUM 40 MG PO TBEC
40.0000 mg | DELAYED_RELEASE_TABLET | Freq: Every day | ORAL | Status: DC
  Administered 2022-03-25 – 2022-03-28 (×4): 40 mg via ORAL
  Filled 2022-03-25 (×3): qty 1

## 2022-03-25 MED ORDER — ORAL CARE MOUTH RINSE: 15.0000 mL | Freq: Once | OROMUCOSAL | Status: AC

## 2022-03-25 MED ORDER — ONDANSETRON HCL 4 MG/2ML IJ SOLN
INTRAMUSCULAR | Status: AC
  Filled 2022-03-25: qty 2

## 2022-03-25 MED ORDER — METOCLOPRAMIDE HCL 5 MG/ML IJ SOLN: 5.0000 mg | Freq: Three times a day (TID) | INTRAMUSCULAR | Status: DC | PRN

## 2022-03-25 MED ORDER — FENTANYL CITRATE (PF) 100 MCG/2ML IJ SOLN
INTRAMUSCULAR | Status: AC
  Filled 2022-03-25: qty 2

## 2022-03-25 MED ORDER — METHOCARBAMOL 1000 MG/10ML IJ SOLN: 500.0000 mg | Freq: Four times a day (QID) | INTRAVENOUS | Status: DC | PRN

## 2022-03-25 MED ORDER — LIDOCAINE 2% (20 MG/ML) 5 ML SYRINGE
INTRAMUSCULAR | Status: DC | PRN
  Administered 2022-03-25: 40 mg via INTRAVENOUS

## 2022-03-25 MED ORDER — LEVOTHYROXINE SODIUM 88 MCG PO TABS
88.0000 ug | ORAL_TABLET | ORAL | Status: DC
  Administered 2022-03-27: 88 ug via ORAL
  Filled 2022-03-25: qty 1

## 2022-03-25 MED ORDER — BUPIVACAINE-EPINEPHRINE (PF) 0.5% -1:200000 IJ SOLN
INTRAMUSCULAR | Status: AC
  Filled 2022-03-25: qty 30

## 2022-03-25 MED ORDER — ROPIVACAINE HCL 5 MG/ML IJ SOLN
INTRAMUSCULAR | Status: DC | PRN
  Administered 2022-03-25: 30 mL via PERINEURAL

## 2022-03-25 MED ORDER — METHOCARBAMOL 500 MG PO TABS
500.0000 mg | ORAL_TABLET | Freq: Four times a day (QID) | ORAL | Status: DC | PRN
  Administered 2022-03-25 – 2022-03-27 (×3): 500 mg via ORAL
  Filled 2022-03-25 (×3): qty 1

## 2022-03-25 MED ORDER — ONDANSETRON HCL 4 MG/2ML IJ SOLN
4.0000 mg | Freq: Four times a day (QID) | INTRAMUSCULAR | Status: DC | PRN
  Administered 2022-03-26: 4 mg via INTRAVENOUS
  Filled 2022-03-25: qty 2

## 2022-03-25 MED ORDER — TRAMADOL HCL 50 MG PO TABS: 50.0000 mg | ORAL_TABLET | Freq: Once | ORAL | Status: DC

## 2022-03-25 MED ORDER — ALUM & MAG HYDROXIDE-SIMETH 200-200-20 MG/5ML PO SUSP
30.0000 mL | ORAL | Status: DC | PRN
Start: 2022-03-25 — End: 2022-03-28
  Administered 2022-03-26: 30 mL via ORAL
  Filled 2022-03-25: qty 30

## 2022-03-25 MED ORDER — LACTATED RINGERS IV SOLN: INTRAVENOUS | Status: DC

## 2022-03-25 MED ORDER — HYDROCODONE-ACETAMINOPHEN 7.5-325 MG PO TABS: 1.0000 | ORAL_TABLET | ORAL | Status: DC | PRN

## 2022-03-25 MED ORDER — TRAMADOL HCL 50 MG PO TABS
ORAL_TABLET | ORAL | Status: AC
  Filled 2022-03-25: qty 1

## 2022-03-25 MED ORDER — PROPOFOL 10 MG/ML IV BOLUS
INTRAVENOUS | Status: DC | PRN
  Administered 2022-03-25: 100 mg via INTRAVENOUS
  Administered 2022-03-25: 50 mg via INTRAVENOUS

## 2022-03-25 MED ORDER — ONDANSETRON HCL 4 MG/2ML IJ SOLN: 4.0000 mg | Freq: Once | INTRAMUSCULAR | Status: DC | PRN

## 2022-03-25 MED ORDER — LOSARTAN POTASSIUM 50 MG PO TABS
100.0000 mg | ORAL_TABLET | Freq: Every day | ORAL | Status: DC
  Administered 2022-03-25 – 2022-03-27 (×3): 100 mg via ORAL
  Filled 2022-03-25 (×3): qty 2

## 2022-03-25 MED ORDER — DEXAMETHASONE SODIUM PHOSPHATE 10 MG/ML IJ SOLN
INTRAMUSCULAR | Status: DC | PRN
  Administered 2022-03-25: 5 mg via INTRAVENOUS

## 2022-03-25 MED ORDER — MENTHOL 3 MG MT LOZG: 1.0000 | LOZENGE | OROMUCOSAL | Status: DC | PRN

## 2022-03-25 MED ORDER — ACETAMINOPHEN 325 MG PO TABS: 325.0000 mg | ORAL_TABLET | Freq: Four times a day (QID) | ORAL | Status: DC | PRN

## 2022-03-25 MED ORDER — EZETIMIBE 10 MG PO TABS
10.0000 mg | ORAL_TABLET | Freq: Every day | ORAL | Status: DC
  Administered 2022-03-26 – 2022-03-28 (×3): 10 mg via ORAL
  Filled 2022-03-25 (×3): qty 1

## 2022-03-25 MED ORDER — FENTANYL CITRATE (PF) 100 MCG/2ML IJ SOLN: 25.0000 ug | INTRAMUSCULAR | Status: DC | PRN

## 2022-03-25 MED ORDER — SODIUM CHLORIDE 0.9 % IV SOLN: INTRAVENOUS | Status: DC

## 2022-03-25 MED ORDER — FERROUS SULFATE 325 (65 FE) MG PO TABS
ORAL_TABLET | Freq: Every day | ORAL | Status: DC
  Administered 2022-03-26: 325 mg via ORAL
  Filled 2022-03-25 (×3): qty 1

## 2022-03-25 MED ORDER — APIXABAN 2.5 MG PO TABS: 2.5000 mg | ORAL_TABLET | Freq: Two times a day (BID) | ORAL | Status: DC

## 2022-03-25 MED ORDER — VANCOMYCIN HCL IN DEXTROSE 1-5 GM/200ML-% IV SOLN
1000.0000 mg | INTRAVENOUS | Status: AC
  Administered 2022-03-25: 1000 mg via INTRAVENOUS
  Filled 2022-03-25: qty 200

## 2022-03-25 MED ORDER — ONDANSETRON HCL 4 MG/2ML IJ SOLN
INTRAMUSCULAR | Status: DC | PRN
  Administered 2022-03-25: 4 mg via INTRAVENOUS

## 2022-03-25 MED ORDER — CHLORHEXIDINE GLUCONATE 0.12 % MT SOLN
OROMUCOSAL | Status: AC
  Administered 2022-03-25: 15 mL via OROMUCOSAL
  Filled 2022-03-25: qty 15

## 2022-03-25 MED ORDER — CHLORHEXIDINE GLUCONATE 0.12 % MT SOLN: 15.0000 mL | Freq: Once | OROMUCOSAL | Status: AC

## 2022-03-25 MED ORDER — METOPROLOL SUCCINATE ER 25 MG PO TB24
25.0000 mg | ORAL_TABLET | Freq: Every day | ORAL | Status: DC
  Administered 2022-03-26 – 2022-03-28 (×3): 25 mg via ORAL
  Filled 2022-03-25 (×3): qty 1

## 2022-03-25 MED ORDER — PHENYLEPHRINE 80 MCG/ML (10ML) SYRINGE FOR IV PUSH (FOR BLOOD PRESSURE SUPPORT)
PREFILLED_SYRINGE | INTRAVENOUS | Status: DC | PRN
  Administered 2022-03-25 (×3): 80 ug via INTRAVENOUS

## 2022-03-25 MED ORDER — HYDRALAZINE HCL 20 MG/ML IJ SOLN
INTRAMUSCULAR | Status: AC
  Filled 2022-03-25: qty 1

## 2022-03-25 MED ORDER — 0.9 % SODIUM CHLORIDE (POUR BTL) OPTIME
TOPICAL | Status: DC | PRN
  Administered 2022-03-25: 1000 mL

## 2022-03-25 MED ORDER — FENTANYL CITRATE (PF) 250 MCG/5ML IJ SOLN
INTRAMUSCULAR | Status: DC | PRN
Start: 2022-03-25 — End: 2022-03-25
  Administered 2022-03-25: 25 ug via INTRAVENOUS
  Administered 2022-03-25: 50 ug via INTRAVENOUS
  Administered 2022-03-25 (×2): 25 ug via INTRAVENOUS

## 2022-03-25 MED ORDER — POVIDONE-IODINE 10 % EX SWAB
2.0000 | Freq: Once | CUTANEOUS | Status: AC
  Administered 2022-03-25: 2 via TOPICAL

## 2022-03-25 SURGICAL SUPPLY — 64 items
BAG COUNTER SPONGE SURGICOUNT (BAG) ×1 IMPLANT
BANDAGE ESMARK 6X9 LF (GAUZE/BANDAGES/DRESSINGS) ×1 IMPLANT
BLADE CLIPPER SURG (BLADE) IMPLANT
BLADE SAG 18X100X1.27 (BLADE) ×1 IMPLANT
BLADE SAGITTAL 25.0X1.27X90 (BLADE) ×1 IMPLANT
BNDG COHESIVE 6X5 TAN STRL LF (GAUZE/BANDAGES/DRESSINGS) ×2 IMPLANT
BNDG ELASTIC 6X5.8 VLCR STR LF (GAUZE/BANDAGES/DRESSINGS) ×1 IMPLANT
BNDG ESMARK 6X9 LF (GAUZE/BANDAGES/DRESSINGS) ×1
BOWL SMART MIX CTS (DISPOSABLE) IMPLANT
COOLER ICEMAN CLASSIC (MISCELLANEOUS) IMPLANT
COVER SURGICAL LIGHT HANDLE (MISCELLANEOUS) ×1 IMPLANT
CUFF TOURN SGL QUICK 34 (TOURNIQUET CUFF)
CUFF TOURN SGL QUICK 42 (TOURNIQUET CUFF) IMPLANT
CUFF TRNQT CYL 34X4.125X (TOURNIQUET CUFF) IMPLANT
DRAPE ORTHO SPLIT 77X108 STRL (DRAPES) ×2
DRAPE SURG ORHT 6 SPLT 77X108 (DRAPES) ×2 IMPLANT
DRAPE U-SHAPE 47X51 STRL (DRAPES) ×1 IMPLANT
DRSG PAD ABDOMINAL 8X10 ST (GAUZE/BANDAGES/DRESSINGS) ×2 IMPLANT
DRSG XEROFORM 1X8 (GAUZE/BANDAGES/DRESSINGS) IMPLANT
DURAPREP 26ML APPLICATOR (WOUND CARE) ×1 IMPLANT
ELECT REM PT RETURN 9FT ADLT (ELECTROSURGICAL) ×1
ELECTRODE REM PT RTRN 9FT ADLT (ELECTROSURGICAL) ×1 IMPLANT
EVACUATOR 1/8 PVC DRAIN (DRAIN) IMPLANT
FACESHIELD STD STERILE (MASK) ×2 IMPLANT
GAUZE SPONGE 4X4 12PLY STRL (GAUZE/BANDAGES/DRESSINGS) ×1 IMPLANT
GAUZE XEROFORM 1X8 LF (GAUZE/BANDAGES/DRESSINGS) ×1 IMPLANT
GLOVE BIO SURGEON STRL SZ8 (GLOVE) ×1 IMPLANT
GLOVE BIOGEL PI IND STRL 8 (GLOVE) ×2 IMPLANT
GLOVE ORTHO TXT STRL SZ7.5 (GLOVE) ×1 IMPLANT
GOWN STRL REUS W/ TWL LRG LVL3 (GOWN DISPOSABLE) ×1 IMPLANT
GOWN STRL REUS W/ TWL XL LVL3 (GOWN DISPOSABLE) ×2 IMPLANT
GOWN STRL REUS W/TWL LRG LVL3 (GOWN DISPOSABLE) ×1
GOWN STRL REUS W/TWL XL LVL3 (GOWN DISPOSABLE) ×2
HANDPIECE INTERPULSE COAX TIP (DISPOSABLE)
IMMOBILIZER KNEE 22 UNIV (SOFTGOODS) ×1 IMPLANT
INSERT TIB LCS RP STD 20 (Knees) IMPLANT
KIT BASIN OR (CUSTOM PROCEDURE TRAY) ×1 IMPLANT
KIT TURNOVER KIT B (KITS) ×1 IMPLANT
MANIFOLD NEPTUNE II (INSTRUMENTS) ×1 IMPLANT
NS IRRIG 1000ML POUR BTL (IV SOLUTION) ×1 IMPLANT
PACK TOTAL JOINT (CUSTOM PROCEDURE TRAY) ×1 IMPLANT
PAD ARMBOARD 7.5X6 YLW CONV (MISCELLANEOUS) ×2 IMPLANT
PAD COLD SHLDR WRAP-ON (PAD) IMPLANT
PADDING CAST COTTON 6X4 STRL (CAST SUPPLIES) ×1 IMPLANT
PEG LCS COM 3 MB STD (Knees) IMPLANT
SET HNDPC FAN SPRY TIP SCT (DISPOSABLE) IMPLANT
SET PAD KNEE POSITIONER (MISCELLANEOUS) ×1 IMPLANT
STAPLER VISISTAT 35W (STAPLE) ×1 IMPLANT
SUCTION FRAZIER HANDLE 10FR (MISCELLANEOUS) ×1
SUCTION TUBE FRAZIER 10FR DISP (MISCELLANEOUS) ×1 IMPLANT
SUT VIC AB 0 CT1 27 (SUTURE) ×2
SUT VIC AB 0 CT1 27XBRD ANBCTR (SUTURE) ×2 IMPLANT
SUT VIC AB 1 CT1 27 (SUTURE) ×3
SUT VIC AB 1 CT1 27XBRD ANBCTR (SUTURE) ×2 IMPLANT
SUT VIC AB 2-0 CT1 27 (SUTURE) ×2
SUT VIC AB 2-0 CT1 TAPERPNT 27 (SUTURE) ×2 IMPLANT
SWAB COLLECTION DEVICE MRSA (MISCELLANEOUS) IMPLANT
SWAB CULTURE ESWAB REG 1ML (MISCELLANEOUS) ×1 IMPLANT
TOWEL GREEN STERILE (TOWEL DISPOSABLE) ×1 IMPLANT
TOWEL GREEN STERILE FF (TOWEL DISPOSABLE) ×1 IMPLANT
TRAY FOLEY W/BAG SLVR 16FR (SET/KITS/TRAYS/PACK)
TRAY FOLEY W/BAG SLVR 16FR ST (SET/KITS/TRAYS/PACK) IMPLANT
WATER STERILE IRR 1000ML POUR (IV SOLUTION) ×3 IMPLANT
WRAP KNEE MAXI GEL POST OP (GAUZE/BANDAGES/DRESSINGS) ×1 IMPLANT

## 2022-03-25 NOTE — H&P (Signed)
[name] is an 88 y.o. female.   Chief Complaint:   Right knee pain with inability to straighten the knee HPI:   The patient is an 88 year old female who has a history of a remote right total knee arthroplasty by one of my partners.  It was actually assessed in April with new x-rays and it looked good.  Over the last week something happened within the knee and the polyliner has become dissociated with the replacement.  Attempts to reduce the right knee were unsuccessful.  X-rays show malalignment of the right total knee components thus necessitating an arthrotomy with revision as indicated of the polyliner versus the entire implant.  This will depend on intraoperative findings.  The patient has had difficulty with her gait a recent due to neurologic issues.  Past Medical History:  Diagnosis Date   Anxiety    Arthritis    Depression    GERD (gastroesophageal reflux disease)    Hearing loss of right ear    Asymmetrical   Hypertension    Hypothyroidism    Pre-diabetes    Pulmonary embolism (HCC)     Past Surgical History:  Procedure Laterality Date   APPENDECTOMY     CATARACT EXTRACTION     FOOT NEUROMA SURGERY Left 1982   FOOT NEUROMA SURGERY Right [phone]   FOOT SURGERY     IVC FILTER INSERTION Right 2011   KNEE ARTHROSCOPY Right 2011   REPLACEMENT TOTAL KNEE     ROTATOR CUFF REPAIR Left 2003   SQUAMOUS CELL CARCINOMA EXCISION     07/2020    Family History  Problem Relation Age of Onset   Asthma Sister    Asthma Brother    Allergic rhinitis Neg Hx    Angioedema Neg Hx    Eczema Neg Hx    Immunodeficiency Neg Hx    Urticaria Neg Hx    Social History:  reports that she has never smoked. She has never used smokeless tobacco. She reports that she does not drink alcohol and does not use drugs.  Allergies:  Allergies  Allergen Reactions   Azithromycin Other (See Comments)    Other Reaction: Other reaction Other reaction(s): Other (See Comments) Other Reaction: Other  reaction    Gabapentin Other (See Comments)    FALLS and makes pts body feels "weird" Patient Reports: Falls like Drop Attacks (no warning, just falls to the floor) Patient Reports: "It makes my body feel weird"    Amoxicillin Itching   Codeine    Feldene [Piroxicam]     Liver enzyme elevation   Pepcid [Famotidine] Other (See Comments)    constipation   Warfarin     Other reaction(s): BRUISING   Atorvastatin Itching    Medications Prior to Admission  Medication Sig Dispense Refill   acetaminophen (TYLENOL) 650 MG CR tablet Take 1,300 mg by mouth every 6 (six) hours as needed for pain.     amitriptyline (ELAVIL) 25 MG tablet Take by mouth.     amLODipine (NORVASC) 2.5 MG tablet Take 2.5 mg by mouth daily.     amLODipine (NORVASC) 5 MG tablet Take by mouth.     AMLODIPINE BESYLATE PO Take 7.5 mg by mouth.      ammonium lactate (LAC-HYDRIN) 12 % lotion Apply topically as needed for dry skin. 400 g 1   cetirizine (ZYRTEC) 10 MG tablet Take 10 mg by mouth in the morning.     clobetasol cream (TEMOVATE) 0.05 % Apply 1 application topically 2 (  two) times daily as needed. 60 g 2   denosumab (PROLIA) 60 MG/ML SOLN injection 60 mg. Injection every 6 months     DULoxetine (CYMBALTA) 60 MG capsule Take 1 capsule by mouth at bedtime.     dupilumab (DUPIXENT) 300 MG/2ML prefilled syringe Inject into the skin. Every 14 days     ezetimibe (ZETIA) 10 MG tablet Take 10 mg by mouth daily.     Ferrous Sulfate 28 MG TABS Take by mouth daily. With food     losartan (COZAAR) 100 MG tablet Take 1 tablet by mouth daily.     metoprolol succinate (TOPROL-XL) 25 MG 24 hr tablet Take 25 mg by mouth daily.     Multiple Vitamins-Minerals (PRESERVISION AREDS 2+MULTI VIT PO) Take by mouth in the morning and at bedtime. With food breakfast and dinner     omeprazole (PRILOSEC) 20 MG capsule Take by mouth.     Polyethyl Glycol-Propyl Glycol 0.4-0.3 % SOLN Systane     SYNTHROID 75 MCG tablet Take 75 mcg by mouth  every other day.     SYNTHROID 88 MCG tablet Take 88 mcg by mouth every other day.     traMADol (ULTRAM) 50 MG tablet at bedtime.      No results found for this or any previous visit (from the past 48 hour(s)). No results found.  Review of Systems  Blood pressure (!) 161/77, pulse 72, temperature 98.5 F (36.9 C), resp. rate 17, height 5\' 5"  (1.651 m), weight 54.4 kg, SpO2 97 %. Physical Exam Vitals reviewed.  Constitutional:      Appearance: Normal appearance.  HENT:     Head: Normocephalic and atraumatic.  Eyes:     Extraocular Movements: Extraocular movements intact.     Pupils: Pupils are equal, round, and reactive to light.  Cardiovascular:     Rate and Rhythm: Normal rate.     Pulses: Normal pulses.  Pulmonary:     Effort: Pulmonary effort is normal.     Breath sounds: Normal breath sounds.  Abdominal:     Palpations: Abdomen is soft.  Musculoskeletal:     Cervical back: Normal range of motion.     Right knee: Effusion present. Decreased range of motion.  Neurological:     Mental Status: She is oriented to person, place, and time.  Psychiatric:        Behavior: Behavior normal.      Assessment/Plan Failed right total knee arthroplasty  Again the plan is to proceed to surgery today for an open arthrotomy of the right knee and removing the original polyethylene liner.  We would then be able to assess the implants and stability with potentially upsizing her polyliner versus having to revise the components as indicated based on our intraoperative findings.  A long and thorough discussion has been had with the patient assessing and discussing the risk and benefits of this type of surgery and she understands the necessity with needing to proceed to surgery today.  The right operative knee has been marked and informed consent is obtained.  , MD 03/25/2022, 11:59 AM

## 2022-03-25 NOTE — Anesthesia Procedure Notes (Signed)
Anesthesia Regional Block: Adductor canal block   Pre-Anesthetic Checklist: , timeout performed,  Correct Patient, Correct Site, Correct Laterality,  Correct Procedure, Correct Position, site marked,  Risks and benefits discussed,  Surgical consent,  Pre-op evaluation,  At surgeon's request and post-op pain management  Laterality: Right  Prep: Maximum Sterile Barrier Precautions used, chloraprep       Needles:  Injection technique: Single-shot  Needle Type: Echogenic Stimulator Needle     Needle Length: 9cm  Needle Gauge: 22     Additional Needles:   Procedures:,,,, ultrasound used (permanent image in chart),,    Narrative:  Start time: 03/25/2022 1:10 PM End time: 03/25/2022 1:15 PM Injection made incrementally with aspirations every 5 mL.  Performed by: Personally  Anesthesiologist: Lannie Fields, DO  Additional Notes: Monitors applied. No increased pain on injection. No increased resistance to injection. Injection made in 5cc increments. Good needle visualization. Patient tolerated procedure well.

## 2022-03-25 NOTE — Anesthesia Procedure Notes (Signed)
Procedure Name: LMA Insertion Date/Time: 03/25/2022 2:33 PM  Performed by: Samara Deist, CRNAPre-anesthesia Checklist: Patient identified, Emergency Drugs available, Suction available and Patient being monitored Patient Re-evaluated:Patient Re-evaluated prior to induction Oxygen Delivery Method: Circle System Utilized Preoxygenation: Pre-oxygenation with 100% oxygen Induction Type: IV induction Ventilation: Mask ventilation without difficulty LMA: LMA inserted LMA Size: 4.0 Number of attempts: 1 Placement Confirmation: positive ETCO2 Tube secured with: Tape Dental Injury: Teeth and Oropharynx as per pre-operative assessment  Comments: Inserted by Hassel Neth

## 2022-03-25 NOTE — Transfer of Care (Signed)
Immediate Anesthesia Transfer of Care Note  Patient: [name]  Procedure(s) Performed: RIGHT TOTAL KNEE POLY LINER EXCHANGE (Right: Knee)  Patient Location: PACU  Anesthesia Type:GA combined with regional for post-op pain  Level of Consciousness: drowsy  Airway & Oxygen Therapy: Patient Spontanous Breathing and Patient connected to nasal cannula oxygen  Post-op Assessment: Report given to RN and Post -op Vital signs reviewed and stable  Post vital signs: Reviewed and stable  Last Vitals:  Vitals Value Taken Time  BP 204/95 03/25/22 1605  Temp 36.3 C 03/25/22 1605  Pulse 81 03/25/22 1612  Resp 20 03/25/22 1612  SpO2 88 % 03/25/22 1612  Vitals shown include unvalidated device data.  Last Pain:  Vitals:   03/25/22 1228  PainSc: 4       Patients Stated Pain Goal: 3 (03/25/22 1228)  Complications: No notable events documented.

## 2022-03-25 NOTE — Op Note (Signed)
Operative Note  Date of operation: 03/25/2022 Preoperative diagnosis: Failed/unstable right total knee arthroplasty Postoperative diagnosis: Same  Procedure: Revision of polyethylene liner right knee with upsizing poly and changing patella poly-liner  Implants: DePuy RP poly size 20 Explants: DePuy RP poly size 12.5  Surgeon: Vanita Panda. Magnus Ivan, MD Assistant: Rexene Edison, PA-C  Anesthesia: #1 right lower extremity adductor canal block          #2 attempted spinal          #3 General  EBL: Less than 100 cc Tourniquet time: Less than 1 hour Antibiotics: 2 g IV Ancef Complications: None  Indications: The patient is an 88 year old female who is just over a decade out from a primary right total knee arthroplasty.  A few months ago she was having symptoms of instability of that right knee, but x-rays were negative in terms of malalignment of the knee and any complicating features or worrisome features of the implants.  Last week she then had the inability to flex or fully extend her right prosthetic knee and new x-rays showed a likely spin-out dislocation of the rotating platform polyethylene liner.  Surgery has been indicated and recommended given the situation.  I talked to the patient and family in length about the need for revision arthroplasty with potentially upsizing the polyethylene liner with the possibility of needing to revise all implants depending on our intraoperative findings and assessment of stability with trial liners in place.  We discussed the risk of acute blood loss anemia, nerve vessel injury, fracture, infection, DVT and implant failure.  Procedure description: After informed consent was obtained and the appropriate right knee was marked, anesthesia obtained right lower extremity regional block in the holding room.  The patient was then brought to the operating room and set up on the operating table where spinal anesthesia was attempted but not successful.  She was  then laid in supine position on the operating table and general anesthesia was obtained.  A nonsterile tourniquet is placed around the upper right thigh and her right thigh, knee, leg, ankle and foot were prepped and draped with DuraPrep and sterile drapes including a sterile stockinette.  A timeout was called and she was identified as the correct patient the correct right knee.  We then used Esmarch wrap of the leg and the tourniquet was sided to 300 mm of pressure.  A direct midline incision was made over the patella and carried proximally distally.  Dissection was carried down to the joint and the medial parapatellar arthrotomy was carried out.  There was a moderate joint effusion encountered.  The knee was opened up and flexed and there was obvious dislocation of the previous polyethylene liner.  This was removed without difficulty.  We then assessed the femoral and tibial components and there was no gross evidence of loosening.  There was missing cement mantle underneath the posterior runner of the medial femoral condyle aspect of the femoral prosthesis but again no gross evidence of loosening and there is no evidence of metallosis.  The previous polyliner was a 12.5 mm thickness polyliner for a size 3 tibia.  Prior to removing the dislocated poly we were able to spend it back into place and assess the stability of the knee with the 12.5 mm thickness liner in place.  There was definitely play with varus and valgus stressing in a drawer sign.  We then removed the previous 12.5 mm polyliner and trialed up to a size 20 mm thickness trial polyliner.  The knee was still extended but it was definitely stable with both varus and valgus stressing as well as a negative drawer sign.  We removed the trial poly and then irrigated the knee with normal saline solution assessing all other aspects of the knee.  The tibial component was well-seated as well.  The patella button was well-seated but we were able to remove the  polyliner from the metal-backed patella and placed a new polyethylene patella button.  We then placed the real size 20 mm thickness RP polyliner for a size 3 tibia.  We placed this without difficulty and again put the knee through significant stressing and it felt to be very stable.  We then closed the arthrotomy with interrupted #1 Vicryl suture followed by 0 Vicryl to close the deep tissue and 2-0 Vicryl for subcutaneous tissue.  The skin was closed with staples.  The tourniquet was let down and hemostasis was obtained with electrocautery.  Well-padded sterile dressings applied and the knee was placed in the immobilizer.  The patient was awakened, extubated and taken to recovery room in stable condition with all final counts being correct and no complications noted.  Of note Rexene Edison, PA-C did assisted in entire case from beginning to end and his assistance was crucial for helping manage soft tissue retraction as well as assisting with guiding implant placement and a layered closure of the wound.

## 2022-03-26 ENCOUNTER — Encounter (HOSPITAL_COMMUNITY): Payer: Self-pay | Admitting: Orthopaedic Surgery

## 2022-03-26 ENCOUNTER — Other Ambulatory Visit: Payer: Self-pay

## 2022-03-26 LAB — CBC
HCT: 35.4 % — ABNORMAL LOW (ref 36.0–46.0)
Hemoglobin: 11.9 g/dL — ABNORMAL LOW (ref 12.0–15.0)
MCH: 30.2 pg (ref 26.0–34.0)
MCHC: 33.6 g/dL (ref 30.0–36.0)
MCV: 89.8 fL (ref 80.0–100.0)
Platelets: 371 10*3/uL (ref 150–400)
RBC: 3.94 MIL/uL (ref 3.87–5.11)
RDW: 13.4 % (ref 11.5–15.5)
WBC: 12.8 10*3/uL — ABNORMAL HIGH (ref 4.0–10.5)
nRBC: 0 % (ref 0.0–0.2)

## 2022-03-26 LAB — BASIC METABOLIC PANEL
Anion gap: 9 (ref 5–15)
BUN: 12 mg/dL (ref 8–23)
CO2: 26 mmol/L (ref 22–32)
Calcium: 9.8 mg/dL (ref 8.9–10.3)
Chloride: 102 mmol/L (ref 98–111)
Creatinine, Ser: 0.6 mg/dL (ref 0.44–1.00)
GFR, Estimated: >60 mL/min (ref >60)
Glucose, Bld: 132 mg/dL — ABNORMAL HIGH (ref 70–99)
Potassium: 4.1 mmol/L (ref 3.5–5.1)
Sodium: 137 mmol/L (ref 135–145)

## 2022-03-26 MED ORDER — HYDRALAZINE HCL 10 MG PO TABS
10.0000 mg | ORAL_TABLET | Freq: Four times a day (QID) | ORAL | Status: DC | PRN
  Administered 2022-03-26: 10 mg via ORAL
  Filled 2022-03-26: qty 1

## 2022-03-26 MED ORDER — FERROUS SULFATE 325 (65 FE) MG PO TABS
325.0000 mg | ORAL_TABLET | Freq: Every day | ORAL | Status: DC
  Administered 2022-03-27 – 2022-03-28 (×2): 325 mg via ORAL
  Filled 2022-03-26 (×2): qty 1

## 2022-03-26 NOTE — [place] FL2 (Signed)
MEDICAID FL2 LEVEL OF CARE SCREENING TOOL     IDENTIFICATION  Patient Name: [name] Birthdate: 1934/01/24 Sex: female Admission Date (Current Location): 03/25/2022  [facility] and [place] Number:  Producer, television/film/video and Address:  [facility] [place], [address] [place], [place] [address]      Provider Number: [id number]  Attending Physician Name and Address:  Kathryne Hitch,*  Relative Name and Phone Number:  Valeria Batman, [phone]    Current Level of Care: Hospital Recommended Level of Care: Skilled Nursing Facility Prior Approval Number:    Date Approved/Denied:   PASRR Number: [id number]  Discharge Plan: SNF    Current Diagnoses: Patient Active Problem List   Diagnosis Date Noted   Failed total knee, right, subsequent encounter 03/25/2022   Status post revision of total knee, right 03/25/2022   Stasis dermatitis of both legs 11/21/2021   Painful total knee replacement, right (HCC) 10/31/2021   Prurigo nodularis 06/24/2021   Other specified hypothyroidism 12/21/2018   Chronic urticaria 12/21/2018   Intrinsic atopic dermatitis 12/07/2018   Allergic urticaria 12/07/2018   Gastroesophageal reflux disease without esophagitis 12/07/2018   Age-related osteoporosis without current pathological fracture 12/07/2018   Essential hypertension 12/07/2018   Allergic rhinitis due to animal hair and dander 12/07/2018   Radiculopathy due to lumbar intervertebral disc disorder 05/31/2018   Radiculopathy of cervical region 05/31/2018   Peripheral neuropathy 05/21/2018   Multifactorial gait disorder 04/26/2018   Right foot drop 04/26/2018   Cerebellar ataxia in diseases classified elsewhere (HCC) 04/26/2018   Sensorineural hearing loss (SNHL) of right ear 04/26/2018   Tinnitus aurium, right 04/26/2018    Orientation RESPIRATION BLADDER Height & Weight     Self, Time, Situation, Place  Normal Continent Weight: 120 lb (54.4  kg) Height:  5\' 5"  (165.1 cm)  BEHAVIORAL SYMPTOMS/MOOD NEUROLOGICAL BOWEL NUTRITION STATUS      Continent Diet (See DC summary)  AMBULATORY STATUS COMMUNICATION OF NEEDS Skin   Limited Assist Verbally Surgical wounds (R leg incision)                       Personal Care Assistance Level of Assistance  Bathing, Feeding, Dressing Bathing Assistance: Limited assistance Feeding assistance: Limited assistance Dressing Assistance: Limited assistance     Functional Limitations Info  Sight, Hearing, Speech Sight Info: Impaired Hearing Info: Impaired Speech Info: Adequate    SPECIAL CARE FACTORS FREQUENCY  PT (By licensed PT), OT (By licensed OT)     PT Frequency: 5x week OT Frequency: 5x week            Contractures Contractures Info: Not present    Additional Factors Info  Code Status, Allergies, Psychotropic Code Status Info: Full Allergies Info: Azithromycin   Gabapentin   Amoxicillin   Codeine   Feldene (Piroxicam)   Pepcid (Famotidine)   Warfarin   Atorvastatin Psychotropic Info: Duloxetine         Current Medications (03/26/2022):  This is the current hospital active medication list Current Facility-Administered Medications  Medication Dose Route Frequency Provider Last Rate Last Admin   0.9 %  sodium chloride infusion   Intravenous Continuous 05/26/2022, MD   Stopped at 03/25/22 2133   acetaminophen (TYLENOL) tablet 1,000 mg  1,000 mg Oral Q6H PRN 2134, MD       acetaminophen (TYLENOL) tablet 325-650 mg  325-650 mg Oral Q6H PRN Kathryne Hitch, MD  alum & mag hydroxide-simeth (MAALOX/MYLANTA) 200-200-20 MG/5ML suspension 30 mL  30 mL Oral Q4H PRN Kathryne Hitch, MD       amLODipine (NORVASC) tablet 2.5 mg  2.5 mg Oral Daily Kathryne Hitch, MD   2.5 mg at 03/26/22 0929   amLODipine (NORVASC) tablet 5 mg  5 mg Oral Daily Kathryne Hitch, MD   5 mg at 03/26/22 5277   apixaban (ELIQUIS) tablet 2.5  mg  2.5 mg Oral BID Eldred Manges, MD   2.5 mg at 03/26/22 0935   diphenhydrAMINE (BENADRYL) 12.5 MG/5ML elixir 12.5-25 mg  12.5-25 mg Oral Q4H PRN Kathryne Hitch, MD       docusate sodium (COLACE) capsule 100 mg  100 mg Oral BID Kathryne Hitch, MD   100 mg at 03/26/22 0933   DULoxetine (CYMBALTA) DR capsule 60 mg  60 mg Oral QHS Kathryne Hitch, MD   60 mg at 03/25/22 2119   ezetimibe (ZETIA) tablet 10 mg  10 mg Oral Daily Kathryne Hitch, MD   10 mg at 03/26/22 0931   ferrous sulfate tablet   Oral Daily Kathryne Hitch, MD   325 mg at 03/26/22 0933   hydrALAZINE (APRESOLINE) tablet 10 mg  10 mg Oral Q6H PRN Kirtland Bouchard, PA-C   10 mg at 03/26/22 1237   HYDROcodone-acetaminophen (NORCO) 7.5-325 MG per tablet 1-2 tablet  1-2 tablet Oral Q4H PRN Kathryne Hitch, MD       HYDROcodone-acetaminophen (NORCO/VICODIN) 5-325 MG per tablet 1-2 tablet  1-2 tablet Oral Q4H PRN Kathryne Hitch, MD   2 tablet at 03/26/22 1116   levothyroxine (SYNTHROID) tablet 75 mcg  75 mcg Oral Moreen Fowler, MD   75 mcg at 03/26/22 0514   [START ON 03/27/2022] levothyroxine (SYNTHROID) tablet 88 mcg  88 mcg Oral Moreen Fowler, MD       losartan (COZAAR) tablet 100 mg  100 mg Oral QHS Kathryne Hitch, MD   100 mg at 03/25/22 2119   menthol-cetylpyridinium (CEPACOL) lozenge 3 mg  1 lozenge Oral PRN Kathryne Hitch, MD       Or   phenol (CHLORASEPTIC) mouth spray 1 spray  1 spray Mouth/Throat PRN Kathryne Hitch, MD       methocarbamol (ROBAXIN) tablet 500 mg  500 mg Oral Q6H PRN Kathryne Hitch, MD   500 mg at 03/25/22 2119   Or   methocarbamol (ROBAXIN) 500 mg in dextrose 5 % 50 mL IVPB  500 mg Intravenous Q6H PRN Kathryne Hitch, MD       metoCLOPramide (REGLAN) tablet 5-10 mg  5-10 mg Oral Q8H PRN Kathryne Hitch, MD       Or   metoCLOPramide (REGLAN) injection 5-10 mg  5-10 mg  Intravenous Q8H PRN Kathryne Hitch, MD       metoprolol succinate (TOPROL-XL) 24 hr tablet 25 mg  25 mg Oral Daily Kathryne Hitch, MD   25 mg at 03/26/22 0930   morphine (PF) 2 MG/ML injection 0.5-1 mg  0.5-1 mg Intravenous Q2H PRN Kathryne Hitch, MD       mupirocin ointment (BACTROBAN) 2 % 1 Application  1 Application Nasal BID Kathryne Hitch, MD   1 Application at 03/26/22 0933   ondansetron (ZOFRAN) tablet 4 mg  4 mg Oral Q6H PRN Kathryne Hitch, MD       Or   ondansetron [facility]) injection 4  mg  4 mg Intravenous Q6H PRN Kathryne Hitch, MD   4 mg at 03/26/22 1237   pantoprazole (PROTONIX) EC tablet 40 mg  40 mg Oral Daily Kathryne Hitch, MD   40 mg at 03/26/22 0933   traMADol (ULTRAM) tablet 50-100 mg  50-100 mg Oral Q6H PRN Kathryne Hitch, MD   50 mg at 03/26/22 3474     Discharge Medications: Please see discharge summary for a list of discharge medications.  Relevant Imaging Results:  Relevant Lab Results:   Additional Information SS# [id number] [place]

## 2022-03-26 NOTE — Plan of Care (Signed)
Pt would like to speak w/ SW about placement. Transferred x1 w/ walker to [facility].     Problem: Education: Goal: Knowledge of the prescribed therapeutic regimen will improve Outcome: Progressing Goal: Individualized Educational Video(s) Outcome: Progressing   Problem: Activity: Goal: Ability to avoid complications of mobility impairment will improve Outcome: Progressing Goal: Range of joint motion will improve Outcome: Progressing   Problem: Clinical Measurements: Goal: Postoperative complications will be avoided or minimized Outcome: Progressing   Problem: Pain Management: Goal: Pain level will decrease with appropriate interventions Outcome: Progressing   Problem: Skin Integrity: Goal: Will show signs of wound healing Outcome: Progressing   Problem: Education: Goal: Knowledge of General Education information will improve Description: Including pain rating scale, medication(s)/side effects and non-pharmacologic comfort measures Outcome: Progressing   Problem: Health Behavior/Discharge Planning: Goal: Ability to manage health-related needs will improve Outcome: Progressing   Problem: Clinical Measurements: Goal: Ability to maintain clinical measurements within normal limits will improve Outcome: Progressing Goal: Will remain free from infection Outcome: Progressing Goal: Diagnostic test results will improve Outcome: Progressing Goal: Respiratory complications will improve Outcome: Progressing Goal: Cardiovascular complication will be avoided Outcome: Progressing   Problem: Activity: Goal: Risk for activity intolerance will decrease Outcome: Progressing   Problem: Nutrition: Goal: Adequate nutrition will be maintained Outcome: Progressing   Problem: Coping: Goal: Level of anxiety will decrease Outcome: Progressing   Problem: Elimination: Goal: Will not experience complications related to bowel motility Outcome: Progressing Goal: Will not experience  complications related to urinary retention Outcome: Progressing   Problem: Pain Managment: Goal: General experience of comfort will improve Outcome: Progressing   Problem: Safety: Goal: Ability to remain free from injury will improve Outcome: Progressing   Problem: Skin Integrity: Goal: Risk for impaired skin integrity will decrease Outcome: Progressing

## 2022-03-26 NOTE — Evaluation (Signed)
Physical Therapy Evaluation Patient Details Name: [name] MRN: [id number] DOB: [birth year] Today's Date: 03/26/2022  History of Present Illness  88 y/o female admitted on 03/25/22 following R TKA revision. PMH: HTN, pre-diabetes, hx of PE, B LE ataxia  Clinical Impression  Patient admitted with the above. PTA, patient lives with husband and was using RW for mobility due to ataxia and R knee instability. Patient presents with pain, weakness, decreased activity tolerance, ataxia, impaired ROM, and impaired balance. Patient required minA for sit to stand transfer and short ambulation to/from bathroom with RW. Complaining of increased pain with weightbearing. Patient will benefit from skilled PT services during acute stay to address listed deficits. Patient prefers discharge to SNF prior to returning home.      Recommendations for follow up therapy are one component of a multi-disciplinary discharge planning process, led by the attending physician.  Recommendations may be updated based on patient status, additional functional criteria and insurance authorization.  Follow Up Recommendations Follow physician's recommendations for discharge plan and follow up therapies Can patient physically be transported by private vehicle: Yes    Assistance Recommended at Discharge Frequent or constant Supervision/Assistance  Patient can return home with the following  A little help with walking and/or transfers;A little help with bathing/dressing/bathroom;Assistance with cooking/housework;Help with stairs or ramp for entrance;Assist for transportation    Equipment Recommendations None recommended by PT  Recommendations for Other Services       Functional Status Assessment Patient has had a recent decline in their functional status and demonstrates the ability to make significant improvements in function in a reasonable and predictable amount of time.     Precautions / Restrictions Precautions Precautions:  Fall;Knee Required Braces or Orthoses: Knee Immobilizer - Right Knee Immobilizer - Right: Discontinue once straight leg raise with < 10 degree lag Restrictions Weight Bearing Restrictions: Yes RLE Weight Bearing: Weight bearing as tolerated      Mobility  Bed Mobility Overal bed mobility: Needs Assistance Bed Mobility: Supine to Sit     Supine to sit: Min guard     General bed mobility comments: min guard for safety. Patient able to lift B LEs to bring to EOB without assistance    Transfers Overall transfer level: Needs assistance Equipment used: Rolling Myranda Pavone (2 wheels) Transfers: Sit to/from Stand Sit to Stand: Min assist           General transfer comment: assist to boost into standing and cues for hand placement    Ambulation/Gait Ambulation/Gait assistance: Min assist Gait Distance (Feet): 10 Feet (+10) Assistive device: Rolling Danial Sisley (2 wheels) Gait Pattern/deviations: Step-to pattern, Decreased stride length, Decreased stance time - right, Decreased weight shift to right, Ataxic Gait velocity: decreased     General Gait Details: mild ataxia noted. Assist for balance. Cues for sequencing and placing foot on ground as patient ambulating on toes on R.  Stairs            Wheelchair Mobility    Modified Rankin (Stroke Patients Only)       Balance Overall balance assessment: Needs assistance Sitting-balance support: No upper extremity supported, Feet supported Sitting balance-Leahy Scale: Fair     Standing balance support: Bilateral upper extremity supported, Reliant on assistive device for balance Standing balance-Leahy Scale: Poor Standing balance comment: reliant on UE support                             Pertinent Vitals/Pain Pain Assessment  Pain Assessment: Faces Faces Pain Scale: Hurts even more Pain Location: R knee Pain Descriptors / Indicators: Grimacing, Guarding Pain Intervention(s): Monitored during session,  Repositioned, Limited activity within patient's tolerance    Home Living Family/patient expects to be discharged to:: Skilled nursing facility Living Arrangements: Spouse/significant other                 Additional Comments: plans to discharge to SNF for rehab prior to returning home with husband who is 49 and per patient "not a good caregiver"    Prior Function Prior Level of Function : Independent/Modified Independent             Mobility Comments: uses RW       Hand Dominance        Extremity/Trunk Assessment   Upper Extremity Assessment Upper Extremity Assessment: Generalized weakness    Lower Extremity Assessment Lower Extremity Assessment: Generalized weakness;RLE deficits/detail RLE Deficits / Details: post op pain and weakness; reports hx of ataxia    Cervical / Trunk Assessment Cervical / Trunk Assessment: Kyphotic  Communication   Communication: HOH  Cognition Arousal/Alertness: Awake/alert Behavior During Therapy: WFL for tasks assessed/performed Overall Cognitive Status: Within Functional Limits for tasks assessed                                          General Comments      Exercises Other Exercises Other Exercises: Instructed patient on ankle pumps, SLR, quad sets,and hip abduction/adduction   Assessment/Plan    PT Assessment Patient needs continued PT services  PT Problem List Decreased strength;Decreased range of motion;Decreased activity tolerance;Decreased balance;Decreased mobility;Decreased coordination;Decreased knowledge of precautions;Pain       PT Treatment Interventions DME instruction;Gait training;Functional mobility training;Therapeutic activities;Therapeutic exercise;Balance training;Patient/family education    PT Goals (Current goals can be found in the Care Plan section)  Acute Rehab PT Goals Patient Stated Goal: to go to rehab PT Goal Formulation: With patient Time For Goal Achievement:  04/09/22 Potential to Achieve Goals: Good    Frequency 7X/week     Co-evaluation               AM-PAC PT "6 Clicks" Mobility  Outcome Measure Help needed turning from your back to your side while in a flat bed without using bedrails?: A Little Help needed moving from lying on your back to sitting on the side of a flat bed without using bedrails?: A Little Help needed moving to and from a bed to a chair (including a wheelchair)?: A Little Help needed standing up from a chair using your arms (e.g., wheelchair or bedside chair)?: A Little Help needed to walk in hospital room?: A Little Help needed climbing 3-5 steps with a railing? : Total 6 Click Score: 16    End of Session Equipment Utilized During Treatment: Gait belt;Right knee immobilizer Activity Tolerance: Patient tolerated treatment well Patient left: in chair;with call bell/phone within reach;with chair alarm set Nurse Communication: Mobility status PT Visit Diagnosis: Unsteadiness on feet (R26.81);Muscle weakness (generalized) (M62.81);Difficulty in walking, not elsewhere classified (R26.2);Pain Pain - Right/Left: Right Pain - part of body: Knee    Time: 6384-5364 PT Time Calculation (min) (ACUTE ONLY): 27 min   Charges:   PT Evaluation $PT Eval Low Complexity: 1 Low PT Treatments $Therapeutic Activity: 8-22 mins        Davaughn Hillyard A. Dan Humphreys, PT, DPT Acute Rehabilitation Services Office  [phone]   [name] 03/26/2022, 10:42 AM

## 2022-03-26 NOTE — TOC Initial Note (Signed)
Transition of Care [facility]) - Initial/Assessment Note    Patient Details  Name: [name] MRN: [id number] Date of Birth: 1934/04/03  [facility] CM/SW Contact:    Coralee Pesa, [place] Phone Number: 03/26/2022, 3:03 PM  Clinical Narrative:                 CSW met with pt and family at bedside. Pt and family agreeable to SNF with a preference for [facility] as they have been there twice. Daughter Juliann Pulse is main contact and also notes an interest in [facility] or a facility near [facility]. Pt is currently on a wait list for ALF at [facility]. CSW reached out to [facility] and they do not have any beds at the moment. CSW to send out referrals. Pt will need a 3 midnight qualifying stay for SNF. TOC will continue to follow for DC needs.  Expected Discharge Plan: [place] Barriers to Discharge: Continued Medical Work up, SNF Pending bed offer   Patient Goals and CMS Choice Patient states their goals for this hospitalization and ongoing recovery are:: Pt states she would like to go to [place]. CMS Medicare.gov Compare Post Acute Care list provided to:: Patient Choice offered to / list presented to : Patient, Adult Children  Expected Discharge Plan and Services Expected Discharge Plan: [place] Acute Care Choice: [place] Living arrangements for the past 2 months: Single Family Home                                      Prior Living Arrangements/Services Living arrangements for the past 2 months: Single Family Home Lives with:: Spouse Patient language and need for interpreter reviewed:: Yes Do you feel safe going back to the place where you live?: Yes      Need for Family Participation in Patient Care: Yes (Comment) Care giver support system in place?: Yes (comment)   Criminal Activity/Legal Involvement Pertinent to Current Situation/Hospitalization: No - Comment as needed  Activities of Daily Living Home  Assistive Devices/Equipment: Brace (specify type), Hearing aid, Blood pressure cuff, Walker (specify type), Shower chair with back, Grab bars in shower, Grab bars around toilet ADL Screening (condition at time of admission) Patient's cognitive ability adequate to safely complete daily activities?: Yes Is the patient deaf or have difficulty hearing?: No Does the patient have difficulty seeing, even when wearing glasses/contacts?: No Does the patient have difficulty concentrating, remembering, or making decisions?: No Patient able to express need for assistance with ADLs?: Yes Does the patient have difficulty dressing or bathing?: No Independently performs ADLs?: Yes (appropriate for developmental age) Does the patient have difficulty walking or climbing stairs?: Yes Weakness of Legs: Both Weakness of Arms/Hands: Both  Permission Sought/Granted Permission sought to share information with : Family Supports Permission granted to share information with : Yes, Verbal Permission Granted  Share Information with NAME: Juliann Pulse     Permission granted to share info w Relationship: Daughter  Permission granted to share info w Contact Information: [phone]  Emotional Assessment Appearance:: Appears stated age Attitude/Demeanor/Rapport: Engaged Affect (typically observed): Appropriate Orientation: : Oriented to Self, Oriented to Place, Oriented to  Time, Oriented to Situation Alcohol / Substance Use: Not Applicable Psych Involvement: No (comment)  Admission diagnosis:  Status post revision of total knee, right [Z96.651] Patient Active Problem List   Diagnosis Date Noted  Failed total knee, right, subsequent encounter 03/25/2022   Status post revision of total knee, right 03/25/2022   Stasis dermatitis of both legs 11/21/2021   Painful total knee replacement, right ([place]) 10/31/2021   Prurigo nodularis 06/24/2021   Other specified hypothyroidism 12/21/2018   Chronic urticaria 12/21/2018    Intrinsic atopic dermatitis 12/07/2018   Allergic urticaria 12/07/2018   Gastroesophageal reflux disease without esophagitis 12/07/2018   Age-related osteoporosis without current pathological fracture 12/07/2018   Essential hypertension 12/07/2018   Allergic rhinitis due to animal hair and dander 12/07/2018   Radiculopathy due to lumbar intervertebral disc disorder 05/31/2018   Radiculopathy of cervical region 05/31/2018   Peripheral neuropathy 05/21/2018   Multifactorial gait disorder 04/26/2018   Right foot drop 04/26/2018   Cerebellar ataxia in diseases classified elsewhere ([place]) 04/26/2018   Sensorineural hearing loss (SNHL) of right ear 04/26/2018   Tinnitus aurium, right 04/26/2018   PCP:  Kelvin Cellar, MD Pharmacy:   [id number] [place] [address]Phone: [phone]: [address] [id number]: [phone]: [phone]    Social Determinants of Health (SDOH) Interventions    Readmission Risk Interventions     No data to display

## 2022-03-26 NOTE — Discharge Instructions (Addendum)
INSTRUCTIONS AFTER JOINT REPLACEMENT   Remove items at home which could result in a fall. This includes throw rugs or furniture in walking pathways ICE to the affected joint every three hours while awake for 30 minutes at a time, for at least the first 3-5 days, and then as needed for pain and swelling.  Continue to use ice for pain and swelling. You may notice swelling that will progress down to the foot and ankle.  This is normal after surgery.  Elevate your leg when you are not up walking on it.   Continue to use the breathing machine you got in the hospital (incentive spirometer) which will help keep your temperature down.  It is common for your temperature to cycle up and down following surgery, especially at night when you are not up moving around and exerting yourself.  The breathing machine keeps your lungs expanded and your temperature down.   DIET:  As you were doing prior to hospitalization, we recommend a well-balanced diet.  DRESSING / WOUND CARE / SHOWERING  Keep the surgical dressing until follow up.  The dressing is water proof, so you can shower without any extra covering.  IF THE DRESSING FALLS OFF or the wound gets wet inside, change the dressing with sterile gauze.  Please use good hand washing techniques before changing the dressing.  Do not use any lotions or creams on the incision until instructed by your surgeon.    ACTIVITY  Increase activity slowly as tolerated, but follow the weight bearing instructions below.   No driving for 6 weeks or until further direction given by your physician.  You cannot drive while taking narcotics.  No lifting or carrying greater than 10 lbs. until further directed by your surgeon. Avoid periods of inactivity such as sitting longer than an hour when not asleep. This helps prevent blood clots.  You may return to work once you are authorized by your doctor.     WEIGHT BEARING   Weight bearing as tolerated with assist device (walker, cane,  etc) as directed, use it as long as suggested by your surgeon or therapist, typically at least 4-6 weeks.   EXERCISES  Results after joint replacement surgery are often greatly improved when you follow the exercise, range of motion and muscle strengthening exercises prescribed by your doctor. Safety measures are also important to protect the joint from further injury. Any time any of these exercises cause you to have increased pain or swelling, decrease what you are doing until you are comfortable again and then slowly increase them. If you have problems or questions, call your caregiver or physical therapist for advice.   Rehabilitation is important following a joint replacement. After just a few days of immobilization, the muscles of the leg can become weakened and shrink (atrophy).  These exercises are designed to build up the tone and strength of the thigh and leg muscles and to improve motion. Often times heat used for twenty to thirty minutes before working out will loosen up your tissues and help with improving the range of motion but do not use heat for the first two weeks following surgery (sometimes heat can increase post-operative swelling).   These exercises can be done on a training (exercise) mat, on the floor, on a table or on a bed. Use whatever works the best and is most comfortable for you.    Use music or television while you are exercising so that the exercises are a pleasant break in your   day. This will make your life better with the exercises acting as a break in your routine that you can look forward to.   Perform all exercises about fifteen times, three times per day or as directed.  You should exercise both the operative leg and the other leg as well.  Exercises include:   Quad Sets - Tighten up the muscle on the front of the thigh (Quad) and hold for 5-10 seconds.   Straight Leg Raises - With your knee straight (if you were given a brace, keep it on), lift the leg to 60  degrees, hold for 3 seconds, and slowly lower the leg.  Perform this exercise against resistance later as your leg gets stronger.  Leg Slides: Lying on your back, slowly slide your foot toward your buttocks, bending your knee up off the floor (only go as far as is comfortable). Then slowly slide your foot back down until your leg is flat on the floor again.  Angel Wings: Lying on your back spread your legs to the side as far apart as you can without causing discomfort.  Hamstring Strength:  Lying on your back, push your heel against the floor with your leg straight by tightening up the muscles of your buttocks.  Repeat, but this time bend your knee to a comfortable angle, and push your heel against the floor.  You may put a pillow under the heel to make it more comfortable if necessary.   A rehabilitation program following joint replacement surgery can speed recovery and prevent re-injury in the future due to weakened muscles. Contact your doctor or a physical therapist for more information on knee rehabilitation.    CONSTIPATION  Constipation is defined medically as fewer than three stools per week and severe constipation as less than one stool per week.  Even if you have a regular bowel pattern at home, your normal regimen is likely to be disrupted due to multiple reasons following surgery.  Combination of anesthesia, postoperative narcotics, change in appetite and fluid intake all can affect your bowels.   YOU MUST use at least one of the following options; they are listed in order of increasing strength to get the job done.  They are all available over the counter, and you may need to use some, POSSIBLY even all of these options:    Drink plenty of fluids (prune juice may be helpful) and high fiber foods Colace 100 mg by mouth twice a day  Senokot for constipation as directed and as needed Dulcolax (bisacodyl), take with full glass of water  Miralax (polyethylene glycol) once or twice a day as  needed.  If you have tried all these things and are unable to have a bowel movement in the first 3-4 days after surgery call either your surgeon or your primary doctor.    If you experience loose stools or diarrhea, hold the medications until you stool forms back up.  If your symptoms do not get better within 1 week or if they get worse, check with your doctor.  If you experience "the worst abdominal pain ever" or develop nausea or vomiting, please contact the office immediately for further recommendations for treatment.   ITCHING:  If you experience itching with your medications, try taking only a single pain pill, or even half a pain pill at a time.  You can also use Benadryl over the counter for itching or also to help with sleep.   TED HOSE STOCKINGS:  Use stockings on both   legs until for at least 2 weeks or as directed by physician office. They may be removed at night for sleeping.  MEDICATIONS:  See your medication summary on the "After Visit Summary" that nursing will review with you.  You may have some home medications which will be placed on hold until you complete the course of blood thinner medication.  It is important for you to complete the blood thinner medication as prescribed.  PRECAUTIONS:  If you experience chest pain or shortness of breath - call 911 immediately for transfer to the hospital emergency department.   If you develop a fever greater that 101 F, purulent drainage from wound, increased redness or drainage from wound, foul odor from the wound/dressing, or calf pain - CONTACT YOUR SURGEON.                                                   FOLLOW-UP APPOINTMENTS:  If you do not already have a post-op appointment, please call the office for an appointment to be seen by your surgeon.  Guidelines for how soon to be seen are listed in your "After Visit Summary", but are typically between 1-4 weeks after surgery.  OTHER INSTRUCTIONS:   Knee Replacement:  Do not place pillow  under knee, focus on keeping the knee straight while resting. CPM instructions: 0-90 degrees, 2 hours in the morning, 2 hours in the afternoon, and 2 hours in the evening. Place foam block, curve side up under heel at all times except when in CPM or when walking.  DO NOT modify, tear, cut, or change the foam block in any way.  POST-OPERATIVE OPIOID TAPER INSTRUCTIONS: It is important to wean off of your opioid medication as soon as possible. If you do not need pain medication after your surgery it is ok to stop day one. Opioids include: Codeine, Hydrocodone(Norco, Vicodin), Oxycodone(Percocet, oxycontin) and hydromorphone amongst others.  Long term and even short term use of opiods can cause: Increased pain response Dependence Constipation Depression Respiratory depression And more.  Withdrawal symptoms can include Flu like symptoms Nausea, vomiting And more Techniques to manage these symptoms Hydrate well Eat regular healthy meals Stay active Use relaxation techniques(deep breathing, meditating, yoga) Do Not substitute Alcohol to help with tapering If you have been on opioids for less than two weeks and do not have pain than it is ok to stop all together.  Plan to wean off of opioids This plan should start within one week post op of your joint replacement. Maintain the same interval or time between taking each dose and first decrease the dose.  Cut the total daily intake of opioids by one tablet each day Next start to increase the time between doses. The last dose that should be eliminated is the evening dose.   MAKE SURE YOU:  Understand these instructions.  Get help right away if you are not doing well or get worse.    Thank you for letting us be a part of your medical care team.  It is a privilege we respect greatly.  We hope these instructions will help you stay on track for a fast and full recovery!     Information on my medicine - ELIQUIS (apixaban)  This medication  education was reviewed with me or my healthcare representative as part of my discharge preparation.  Why was  Eliquis prescribed for you? Eliquis was prescribed for you to reduce the risk of blood clots forming after orthopedic surgery.    What do You need to know about Eliquis? Take your Eliquis TWICE DAILY - one tablet in the morning and one tablet in the evening with or without food.  It would be best to take the dose about the same time each day.  If you have difficulty swallowing the tablet whole please discuss with your pharmacist how to take the medication safely.  Take Eliquis exactly as prescribed by your doctor and DO NOT stop taking Eliquis without talking to the doctor who prescribed the medication.  Stopping without other medication to take the place of Eliquis may increase your risk of developing a clot.  After discharge, you should have regular check-up appointments with your healthcare provider that is prescribing your Eliquis.  What do you do if you miss a dose? If a dose of ELIQUIS is not taken at the scheduled time, take it as soon as possible on the same day and twice-daily administration should be resumed.  The dose should not be doubled to make up for a missed dose.  Do not take more than one tablet of ELIQUIS at the same time.  Important Safety Information A possible side effect of Eliquis is bleeding. You should call your healthcare provider right away if you experience any of the following: Bleeding from an injury or your nose that does not stop. Unusual colored urine (red or dark brown) or unusual colored stools (red or black). Unusual bruising for unknown reasons. A serious fall or if you hit your head (even if there is no bleeding).  Some medicines may interact with Eliquis and might increase your risk of bleeding or clotting while on Eliquis. To help avoid this, consult your healthcare provider or pharmacist prior to using any new prescription or  non-prescription medications, including herbals, vitamins, non-steroidal anti-inflammatory drugs (NSAIDs) and supplements.  This website has more information on Eliquis (apixaban): http://www.eliquis.com/eliquis/home

## 2022-03-26 NOTE — Progress Notes (Signed)
Physical Therapy Treatment Patient Details Name: [name] MRN: [id number] DOB: 1934-04-02 Today's Date: 03/26/2022   History of Present Illness 88 y/o female admitted on 03/25/22 following R TKA revision. PMH: HTN, pre-diabetes, hx of PE, B LE ataxia    PT Comments    Patient progressing towards physical therapy goals. Patient with improved pain control this session compared to first session. Patient ambulated 10' + 30' with RW and minA due to balance deficits. Able to perform supine SLR and hip abduction/adduction without assistance. D/c plan remains appropriate.     Recommendations for follow up therapy are one component of a multi-disciplinary discharge planning process, led by the attending physician.  Recommendations may be updated based on patient status, additional functional criteria and insurance authorization.  Follow Up Recommendations  Follow physician's recommendations for discharge plan and follow up therapies (Plan for SNF)  Can patient physically be transported by private vehicle: Yes   Assistance Recommended at Discharge Frequent or constant Supervision/Assistance  Patient can return home with the following A little help with walking and/or transfers;A little help with bathing/dressing/bathroom;Assistance with cooking/housework;Help with stairs or ramp for entrance;Assist for transportation   Equipment Recommendations  None recommended by PT    Recommendations for Other Services       Precautions / Restrictions Precautions Precautions: Fall;Knee Required Braces or Orthoses: Knee Immobilizer - Right Knee Immobilizer - Right: Discontinue once straight leg raise with < 10 degree lag Restrictions Weight Bearing Restrictions: Yes RLE Weight Bearing: Weight bearing as tolerated     Mobility  Bed Mobility Overal bed mobility: Needs Assistance Bed Mobility: Sit to Supine, Supine to Sit     Supine to sit: Supervision Sit to supine: Supervision   General bed  mobility comments: supervision for safety    Transfers Overall transfer level: Needs assistance Equipment used: Rolling Abid Bolla (2 wheels) Transfers: Sit to/from Stand Sit to Stand: Min guard           General transfer comment: min guard for safety. Good recall of hand placement    Ambulation/Gait Ambulation/Gait assistance: Min assist Gait Distance (Feet): 10 Feet (+30) Assistive device: Rolling Evertt Chouinard (2 wheels) Gait Pattern/deviations: Step-to pattern, Decreased stride length, Decreased stance time - right, Decreased weight shift to right, Ataxic Gait velocity: decreased     General Gait Details: assist for balance and RW at times. Cues for sequencing.   Stairs             Wheelchair Mobility    Modified Rankin (Stroke Patients Only)       Balance Overall balance assessment: Needs assistance Sitting-balance support: No upper extremity supported, Feet supported Sitting balance-Leahy Scale: Fair     Standing balance support: Bilateral upper extremity supported, Reliant on assistive device for balance Standing balance-Leahy Scale: Poor Standing balance comment: reliant on UE support                            Cognition Arousal/Alertness: Awake/alert Behavior During Therapy: WFL for tasks assessed/performed Overall Cognitive Status: Within Functional Limits for tasks assessed                                          Exercises Total Joint Exercises Hip ABduction/ADduction: Right, 5 reps, Supine Straight Leg Raises: Right, 5 reps, Supine Other Exercises Other Exercises: Instructed patient on ankle pumps, SLR, quad sets,and  hip abduction/adduction    General Comments        Pertinent Vitals/Pain Pain Assessment Pain Assessment: Faces Faces Pain Scale: Hurts even more Pain Location: R knee Pain Descriptors / Indicators: Grimacing, Guarding Pain Intervention(s): Monitored during session    Home Living                           Prior Function            PT Goals (current goals can now be found in the care plan section) Acute Rehab PT Goals Patient Stated Goal: to go to rehab PT Goal Formulation: With patient Time For Goal Achievement: 04/09/22 Potential to Achieve Goals: Good Progress towards PT goals: Progressing toward goals    Frequency    7X/week      PT Plan Current plan remains appropriate    Co-evaluation              AM-PAC PT "6 Clicks" Mobility   Outcome Measure  Help needed turning from your back to your side while in a flat bed without using bedrails?: A Little Help needed moving from lying on your back to sitting on the side of a flat bed without using bedrails?: A Little Help needed moving to and from a bed to a chair (including a wheelchair)?: A Little Help needed standing up from a chair using your arms (e.g., wheelchair or bedside chair)?: A Little Help needed to walk in hospital room?: A Little Help needed climbing 3-5 steps with a railing? : Total 6 Click Score: 16    End of Session Equipment Utilized During Treatment: Gait belt;Right knee immobilizer Activity Tolerance: Patient tolerated treatment well Patient left: in bed;with call bell/phone within reach;with bed alarm set;with family/visitor present Nurse Communication: Mobility status PT Visit Diagnosis: Unsteadiness on feet (R26.81);Muscle weakness (generalized) (M62.81);Difficulty in walking, not elsewhere classified (R26.2);Pain Pain - Right/Left: Right Pain - part of body: Knee     Time: 3710-6269 PT Time Calculation (min) (ACUTE ONLY): 33 min  Charges:  $Gait Training: 8-22 mins $Therapeutic Exercise: 8-22 mins                     Keia Rask A. Dan Humphreys PT, DPT Acute Rehabilitation Services Office [phone]    Viviann Spare 03/26/2022, 3:01 PM

## 2022-03-26 NOTE — Progress Notes (Signed)
Subjective: 1 Day Post-Op Procedure(s) (LRB): RIGHT TOTAL KNEE POLY LINER EXCHANGE (Right) Patient reports pain as mild.  Tolerated surgery well.  Objective: Vital signs in last 24 hours: Temp:  [97.4 F (36.3 C)-98.5 F (36.9 C)] 98.3 F (36.8 C) (09/06 0511) Pulse Rate:  [72-95] 95 (09/06 0511) Resp:  [13-22] 18 (09/06 0511) BP: (122-206)/(52-115) 131/79 (09/06 0511) SpO2:  [91 %-100 %] 96 % (09/06 0511) Weight:  [54.4 kg] 54.4 kg (09/05 1150)  Intake/Output from previous day: 09/05 0701 - 09/06 0700 In: 600 [I.V.:600] Out: -  Intake/Output this shift: No intake/output data recorded.  Recent Labs    03/25/22 1216 03/26/22 0142  HGB 12.4 11.9*   Recent Labs    03/25/22 1216 03/26/22 0142  WBC 8.0 12.8*  RBC 4.02 3.94  HCT 37.1 35.4*  PLT 339 371   Recent Labs    03/25/22 1216 03/26/22 0142  NA 138 137  K 3.6 4.1  CL 105 102  CO2 24 26  BUN 16 12  CREATININE 0.69 0.60  GLUCOSE 95 132*  CALCIUM 10.2 9.8   No results for input(s): "LABPT", "INR" in the last 72 hours.  Sensation intact distally Intact pulses distally Dorsiflexion/Plantar flexion intact Incision: dressing C/D/I Compartment soft   Assessment/Plan: 1 Day Post-Op Procedure(s) (LRB): RIGHT TOTAL KNEE POLY LINER EXCHANGE (Right) Up with therapy Discharge to SNF next few days (Consult transitional care team)      Kathryne Hitch 03/26/2022, 7:36 AM

## 2022-03-26 NOTE — Anesthesia Postprocedure Evaluation (Signed)
Anesthesia Post Note  Patient: [name]  Procedure(s) Performed: RIGHT TOTAL KNEE POLY LINER EXCHANGE (Right: Knee)     Patient location during evaluation: PACU Anesthesia Type: Regional, Spinal and MAC Level of consciousness: awake and alert Pain management: pain level controlled Vital Signs Assessment: post-procedure vital signs reviewed and stable Respiratory status: spontaneous breathing, nonlabored ventilation, respiratory function stable and patient connected to nasal cannula oxygen Cardiovascular status: stable and blood pressure returned to baseline Postop Assessment: no apparent nausea or vomiting Anesthetic complications: no   No notable events documented.  Last Vitals:  Vitals:   03/26/22 0928 03/26/22 0930  BP: (!) 181/87   Pulse:  89  Resp:    Temp:    SpO2:      Last Pain:  Vitals:   03/26/22 0800  TempSrc: Oral  PainSc: 0-No pain                 Belenda Cruise P Tahjir Silveria

## 2022-03-27 MED ORDER — APIXABAN 2.5 MG PO TABS: 2.5000 mg | ORAL_TABLET | Freq: Two times a day (BID) | ORAL | 0 refills | Status: DC

## 2022-03-27 MED ORDER — TRAMADOL HCL 50 MG PO TABS: 50.0000 mg | ORAL_TABLET | Freq: Four times a day (QID) | ORAL | 0 refills | Status: AC | PRN

## 2022-03-27 NOTE — Progress Notes (Signed)
Patient ID: [name], female   DOB: [birth year], 88 y.o.   MRN: [id number] The patient is awake and alert.  She is following commands appropriately.  She denies any symptoms of right knee instability.  Social work has seen the patient and the [place] 2 has been signed.  They are looking into short-term skilled nursing bed availability at hopefully where the patient and her family would like her to go.  Overall the knee seems to be doing well.  I did change the dressing.  Her vital signs are much better today with her blood pressure becoming more normalized.  She reports okay pain tolerance.  Discharge is now pending bed availability for short-term skilled nursing.  Apparently she does have to stay at least 3 midnights prior to discharge to skilled nursing which hopefully can done by tomorrow.

## 2022-03-27 NOTE — Progress Notes (Signed)
Physical Therapy Treatment Patient Details Name: [name] MRN: [id number] DOB: March 03, 1934 Today's Date: 03/27/2022   History of Present Illness 88 y/o female admitted on 03/25/22 following R TKA revision. PMH: HTN, pre-diabetes, hx of PE, B LE ataxia    PT Comments    Patient progressing towards physical therapy goals. Patient increased ambulation distance this session with RW and min guard-minA. Patient with improved pain control compared to previous session. Performed exercises while long sitting in bed for R LE strengthening. D/c plan remains appropriate.     Recommendations for follow up therapy are one component of a multi-disciplinary discharge planning process, led by the attending physician.  Recommendations may be updated based on patient status, additional functional criteria and insurance authorization.  Follow Up Recommendations  Follow physician's recommendations for discharge plan and follow up therapies (plan is SNF) Can patient physically be transported by private vehicle: Yes   Assistance Recommended at Discharge Frequent or constant Supervision/Assistance  Patient can return home with the following A little help with walking and/or transfers;A little help with bathing/dressing/bathroom;Assistance with cooking/housework;Help with stairs or ramp for entrance;Assist for transportation   Equipment Recommendations  None recommended by PT    Recommendations for Other Services       Precautions / Restrictions Precautions Precautions: Fall;Knee Required Braces or Orthoses: Knee Immobilizer - Right Knee Immobilizer - Right: Discontinue once straight leg raise with < 10 degree lag Restrictions Weight Bearing Restrictions: Yes RLE Weight Bearing: Weight bearing as tolerated     Mobility  Bed Mobility Overal bed mobility: Needs Assistance Bed Mobility: Supine to Sit     Supine to sit: Supervision     General bed mobility comments: supervision for safety     Transfers Overall transfer level: Needs assistance Equipment used: Rolling Raeli Wiens (2 wheels) Transfers: Sit to/from Stand Sit to Stand: Min guard           General transfer comment: min guard for safety. Good recall of hand placement    Ambulation/Gait Ambulation/Gait assistance: Min guard, Min assist Gait Distance (Feet): 60 Feet Assistive device: Rolling Elle Vezina (2 wheels) Gait Pattern/deviations: Step-to pattern, Decreased stride length, Decreased stance time - right, Decreased weight shift to right, Ataxic Gait velocity: decreased     General Gait Details: assist for balance and RW at times. Cues for sequencing.   Stairs             Wheelchair Mobility    Modified Rankin (Stroke Patients Only)       Balance Overall balance assessment: Needs assistance Sitting-balance support: No upper extremity supported, Feet supported Sitting balance-Leahy Scale: Fair     Standing balance support: Bilateral upper extremity supported, Reliant on assistive device for balance Standing balance-Leahy Scale: Poor Standing balance comment: reliant on UE support                            Cognition Arousal/Alertness: Awake/alert Behavior During Therapy: WFL for tasks assessed/performed Overall Cognitive Status: Within Functional Limits for tasks assessed                                          Exercises Total Joint Exercises Ankle Circles/Pumps: Both, 10 reps, Seated Hip ABduction/ADduction: Right, 10 reps (long sitting) Straight Leg Raises: Right, 10 reps (long sitting)    General Comments  Pertinent Vitals/Pain Pain Assessment Pain Assessment: Faces Faces Pain Scale: Hurts little more Pain Location: R knee Pain Descriptors / Indicators: Grimacing, Guarding Pain Intervention(s): Monitored during session    Home Living                          Prior Function            PT Goals (current goals can now be  found in the care plan section) Acute Rehab PT Goals Patient Stated Goal: to go to rehab PT Goal Formulation: With patient Time For Goal Achievement: 04/09/22 Potential to Achieve Goals: Good Progress towards PT goals: Progressing toward goals    Frequency    7X/week      PT Plan Current plan remains appropriate    Co-evaluation              AM-PAC PT "6 Clicks" Mobility   Outcome Measure  Help needed turning from your back to your side while in a flat bed without using bedrails?: A Little Help needed moving from lying on your back to sitting on the side of a flat bed without using bedrails?: A Little Help needed moving to and from a bed to a chair (including a wheelchair)?: A Little Help needed standing up from a chair using your arms (e.g., wheelchair or bedside chair)?: A Little Help needed to walk in hospital room?: A Little Help needed climbing 3-5 steps with a railing? : Total 6 Click Score: 16    End of Session Equipment Utilized During Treatment: Gait belt;Right knee immobilizer Activity Tolerance: Patient tolerated treatment well Patient left: in chair;with call bell/phone within reach;with chair alarm set;with family/visitor present Nurse Communication: Mobility status PT Visit Diagnosis: Unsteadiness on feet (R26.81);Muscle weakness (generalized) (M62.81);Difficulty in walking, not elsewhere classified (R26.2);Pain Pain - Right/Left: Right Pain - part of body: Knee     Time: 3818-2993 PT Time Calculation (min) (ACUTE ONLY): 21 min  Charges:  $Gait Training: 8-22 mins                     Dailah Opperman A. Dan Humphreys PT, DPT Acute Rehabilitation Services Office [phone]    Viviann Spare 03/27/2022, 3:45 PM

## 2022-03-27 NOTE — Plan of Care (Signed)
  Problem: Activity: Goal: Ability to avoid complications of mobility impairment will improve Outcome: Progressing   Problem: Education: Goal: Knowledge of General Education information will improve Description: Including pain rating scale, medication(s)/side effects and non-pharmacologic comfort measures Outcome: Progressing   Problem: Health Behavior/Discharge Planning: Goal: Ability to manage health-related needs will improve Outcome: Progressing   Problem: Clinical Measurements: Goal: Ability to maintain clinical measurements within normal limits will improve Outcome: Progressing

## 2022-03-27 NOTE — TOC Progression Note (Signed)
Transition of Care [facility]) - Progression Note    Patient Details  Name: [name] MRN: [id number] Date of Birth: 1934/06/09  Transition of Care [facility]) CM/SW Contact  Carley Hammed, [place] Phone Number: 03/27/2022, 12:03 PM  Clinical Narrative:     CSW spoke with pt at bedside and followed up with daughter at her request. Bed offers were reviewed. Family interested in [facility]. CSW confirmed with facility they are able to accept pt tomorrow. CSW updated family and let facility know to expect pt tomorrow. TOC will continue to follow for DC needs.  Expected Discharge Plan: Skilled Nursing Facility Barriers to Discharge: Continued Medical Work up, SNF Pending bed offer  Expected Discharge Plan and Services Expected Discharge Plan: Skilled Nursing Facility     Post Acute Care Choice: Skilled Nursing Facility Living arrangements for the past 2 months: Single Family Home                                       Social Determinants of Health (SDOH) Interventions    Readmission Risk Interventions     No data to display

## 2022-03-28 NOTE — Discharge Summary (Signed)
Patient ID: [name] MRN: [id number] DOB/AGE: Mar 26, 1934 88 y.o.  Admit date: 03/25/2022 Discharge date: 03/28/2022  Admission Diagnoses:  Principal Problem:   Failed total knee, right, subsequent encounter Active Problems:   Status post revision of total knee, right   Discharge Diagnoses:  Same  Past Medical History:  Diagnosis Date   Anxiety    Arthritis    Depression    GERD (gastroesophageal reflux disease)    Hearing loss of right ear    Asymmetrical   Hypertension    Hypothyroidism    Pre-diabetes    Pulmonary embolism (HCC)     Surgeries: Procedure(s): RIGHT TOTAL KNEE POLY LINER EXCHANGE on 03/25/2022   Consultants:   Discharged Condition: Improved  Hospital Course: [name] is an 88 y.o. female who was admitted 03/25/2022 for operative treatment ofFailed total knee, right, subsequent encounter. Patient has severe unremitting pain that affects sleep, daily activities, and work/hobbies. After pre-op clearance the patient was taken to the operating room on 03/25/2022 and underwent  Procedure(s): RIGHT TOTAL KNEE POLY LINER EXCHANGE.    Patient was given perioperative antibiotics:  Anti-infectives (From admission, onward)    Start     Dose/Rate Route Frequency Ordered Stop   03/25/22 2145  ceFAZolin (ANCEF) IVPB 1 g/50 mL premix  Status:  Discontinued        1 g 100 mL/hr over 30 Minutes Intravenous Every 6 hours 03/25/22 2052 03/25/22 2126   03/25/22 1300  vancomycin (VANCOCIN) IVPB 1000 mg/200 mL premix        1,000 mg 200 mL/hr over 60 Minutes Intravenous On call to O.R. 03/25/22 1152 03/25/22 1354        Patient was given sequential compression devices, early ambulation, and chemoprophylaxis to prevent DVT.  Patient benefited maximally from hospital stay and there were no complications.    Recent vital signs: Patient Vitals for the past 24 hrs:  BP Temp Temp src Pulse Resp SpO2  03/27/22 2038 (!) 149/66 98 F (36.7 C) Oral 79 18 98 %  03/27/22  1435 136/65 97.9 F (36.6 C) Oral 80 18 96 %  03/27/22 0843 131/62 98.3 F (36.8 C) Oral 75 16 99 %     Recent laboratory studies:  Recent Labs    03/25/22 1216 03/26/22 0142  WBC 8.0 12.8*  HGB 12.4 11.9*  HCT 37.1 35.4*  PLT 339 371  NA 138 137  K 3.6 4.1  CL 105 102  CO2 24 26  BUN 16 12  CREATININE 0.69 0.60  GLUCOSE 95 132*  CALCIUM 10.2 9.8     Discharge Medications:   Allergies as of 03/28/2022       Reactions   Azithromycin Other (See Comments)   Loss of hearing    Gabapentin Other (See Comments)   FALLS and makes pts body feels "weird" Patient Reports: Falls like Drop Attacks (no warning, just falls to the floor) Patient Reports: "It makes my body feel weird"   Amoxicillin Itching   Codeine Nausea And Vomiting   Headaches    Feldene [piroxicam] Other (See Comments)   Liver enzyme elevation   Pepcid [famotidine] Other (See Comments)   constipation   Warfarin Other (See Comments)   BRUISING   Atorvastatin Itching        Medication List     TAKE these medications    acetaminophen 650 MG CR tablet Commonly known as: TYLENOL Take 1,300 mg by mouth 2 (two) times daily as needed for pain.  amitriptyline 25 MG tablet Commonly known as: ELAVIL Take by mouth.   amLODipine 2.5 MG tablet Commonly known as: NORVASC Take 2.5 mg by mouth daily.   amLODipine 5 MG tablet Commonly known as: NORVASC Take 5 mg by mouth daily.   ammonium lactate 12 % lotion Commonly known as: LAC-HYDRIN Apply topically as needed for dry skin. What changed:  how much to take reasons to take this   apixaban 2.5 MG Tabs tablet Commonly known as: ELIQUIS Take 1 tablet (2.5 mg total) by mouth 2 (two) times daily. Take twice daily for only 10 days.   cetirizine 10 MG tablet Commonly known as: ZYRTEC Take 10 mg by mouth daily as needed for allergies.   clobetasol cream 0.05 % Commonly known as: TEMOVATE Apply 1 application topically 2 (two) times daily as  needed. What changed:  when to take this reasons to take this   DULoxetine 60 MG capsule Commonly known as: CYMBALTA Take 60 mg by mouth daily.   dupilumab 300 MG/2ML prefilled syringe Commonly known as: DUPIXENT Inject 300 mg into the skin every 14 (fourteen) days. Every 14 days   ezetimibe 10 MG tablet Commonly known as: ZETIA Take 10 mg by mouth daily.   Ferrous Sulfate 28 MG Tabs Take 28 mg by mouth daily.   losartan 100 MG tablet Commonly known as: COZAAR Take 100 mg by mouth at bedtime.   metoprolol succinate 25 MG 24 hr tablet Commonly known as: TOPROL-XL Take 25 mg by mouth at bedtime.   omeprazole 20 MG capsule Commonly known as: PRILOSEC Take 20 mg by mouth daily.   Polyethyl Glycol-Propyl Glycol 0.4-0.3 % Soln Place 2 drops into both eyes daily. Systane   PRESERVISION AREDS 2+MULTI VIT PO Take 1 tablet by mouth in the morning and at bedtime.   Prolia 60 MG/ML Soln injection Generic drug: denosumab 60 mg. Injection every 6 months   Synthroid 88 MCG tablet Generic drug: levothyroxine Take 88 mcg by mouth every other day.   Synthroid 75 MCG tablet Generic drug: levothyroxine Take 75 mcg by mouth every other day.   traMADol 50 MG tablet Commonly known as: ULTRAM Take 1-2 tablets (50-100 mg total) by mouth every 6 (six) hours as needed for moderate pain. What changed:  how much to take when to take this        Diagnostic Studies: DG Knee Right Port  Result Date: 03/25/2022 CLINICAL DATA:  Status post revision of total knee arthroplasty. EXAM: PORTABLE RIGHT KNEE - 1-2 VIEW COMPARISON:  Radiograph 03/19/2022 FINDINGS: Right knee arthroplasty in expected alignment. The radiolucent spacer appears normally positioned on the current exam. No periprosthetic lucency or fracture. Recent postsurgical change includes air and edema in the soft tissues and joint space. Anterior skin staples in place. IMPRESSION: Right knee arthroplasty in expected alignment. No  immediate postoperative complication. Electronically Signed   By: Narda Rutherford M.D.   On: 03/25/2022 17:23   DG Knee 2 Views Right  Result Date: 03/19/2022 CLINICAL DATA:  Knee pain since this morning. EXAM: RIGHT KNEE - 1-2 VIEW COMPARISON:  10/31/2021 FINDINGS: Total knee arthroplasty. Internal radiolucent component which has rotated compared to prior based on the internal markers with posterolateral translation of the knee and lateral widening. No acute fracture. IMPRESSION: Malpositioned radiolucent spacer which has rotated since prior with joint deformity. Electronically Signed   By: Tiburcio Pea M.D.   On: 03/19/2022 11:43    Disposition: Discharge disposition: 03-Skilled Nursing Facility  Contact information for follow-up providers     Kathryne Hitch, MD Follow up in 2 week(s).   Specialty: Orthopedic Surgery Contact information: [address] [place] [address] [phone]              Contact information for after-discharge care     Destination     [facility] SN[facility] .   Service: Skilled Nursing Contact information: [address] [place] [address] [phone]                      Signed: Kathryne Hitch 03/28/2022, 6:43 AM

## 2022-03-28 NOTE — Progress Notes (Signed)
Physical Therapy Treatment Patient Details Name: [name] MRN: [id number] DOB: 29-Apr-1934 Today's Date: 03/28/2022   History of Present Illness 88 y/o female admitted on 03/25/22 following R TKA revision. PMH: HTN, pre-diabetes, hx of PE, B LE ataxia    PT Comments    Patient making good progress with mobility, slightly limited gait distance due to fatigue today. Pt demonstrates good carryover for safe bed mobility and transfers with RW. EOS reviewed exercises for ROM and strengthening in recliner. Will continue to progress in acute setting. Recommend SNF rehab.    Recommendations for follow up therapy are one component of a multi-disciplinary discharge planning process, led by the attending physician.  Recommendations may be updated based on patient status, additional functional criteria and insurance authorization.  PT Recommendation   Follow Up Recommendations Follow physician's recommendations for discharge plan and follow up therapies  [plan is SNF] Filed 03/28/2022 0908  Can patient physically be transported by private vehicle Yes Filed 03/28/2022 0908  Assistance recommended at discharge Frequent or constant Supervision/Assistance Filed 03/28/2022 0908  Patient can return home with the following A little help with walking and/or transfers, A little help with bathing/dressing/bathroom, Assistance with cooking/housework, Help with stairs or ramp for entrance, Assist for transportation [facility] 03/28/2022 0908  Functional Status Assessment Patient has had a recent decline in their functional status and demonstrates the ability to make significant improvements in function in a reasonable and predictable amount of time. Filed 03/26/2022 1035  PT equipment None recommended by PT Filed 03/28/2022 0908    Mobility  Bed Mobility Overal bed mobility: Needs Assistance Bed Mobility: Supine to Sit     Supine to sit: Supervision     General bed mobility comments: supervision for safety, HOB  slightly elevated, pt taking extra time    Transfers Overall transfer level: Needs assistance Equipment used: Rolling walker (2 wheels) Transfers: Sit to/from Stand Sit to Stand: Min assist           General transfer comment: assist to power up, pt slightly more stiff this AM. extra time needed and assist to steady with hand transition to RW during rise. stand from EOB and recliner.    Ambulation/Gait Ambulation/Gait assistance: Min guard, Min assist Gait Distance (Feet): 40 Feet Assistive device: Rolling walker (2 wheels) Gait Pattern/deviations: Step-to pattern, Decreased stride length, Decreased stance time - right, Decreased weight shift to right, Ataxic Gait velocity: decreased     General Gait Details: pt maintined safe proximity to RW, min assist at start to steady, pt progressed to min guard. pt more fatigued and slightly dizzy at start but improved as gait progressed.   Stairs             Wheelchair Mobility    Modified Rankin (Stroke Patients Only)       Balance Overall balance assessment: Needs assistance Sitting-balance support: No upper extremity supported, Feet supported Sitting balance-Leahy Scale: Fair     Standing balance support: Bilateral upper extremity supported, Reliant on assistive device for balance Standing balance-Leahy Scale: Poor Standing balance comment: reliant on UE support                            Cognition Arousal/Alertness: Awake/alert Behavior During Therapy: WFL for tasks assessed/performed Overall Cognitive Status: Within Functional Limits for tasks assessed  Exercises Total Joint Exercises Ankle Circles/Pumps: Both, 10 reps, Seated Hip ABduction/ADduction: Right, 10 reps (long sitting) Straight Leg Raises: Right, 10 reps (long sitting)    General Comments        Pertinent Vitals/Pain Pain Assessment Pain Assessment: 0-10 Pain Score: 4   Pain Location: R knee Pain Descriptors / Indicators: Grimacing, Guarding Pain Intervention(s): Limited activity within patient's tolerance, Monitored during session, Repositioned, Patient requesting pain meds-RN notified       03/28/22 0908  PT - End of Session  Equipment Utilized During Treatment Gait belt;Right knee immobilizer  Activity Tolerance Patient tolerated treatment well  Patient left in chair;with call bell/phone within reach;with chair alarm set;with family/visitor present  Nurse Communication Mobility status   PT - Assessment/Plan  PT Plan Current plan remains appropriate  PT Visit Diagnosis Unsteadiness on feet (R26.81);Muscle weakness (generalized) (M62.81);Difficulty in walking, not elsewhere classified (R26.2);Pain  Pain - Right/Left Right  Pain - part of body Knee  PT Frequency (ACUTE ONLY) 7X/week  Follow Up Recommendations Follow physician's recommendations for discharge plan and follow up therapies (plan is SNF)  Can patient physically be transported by private vehicle Yes  Assistance recommended at discharge Frequent or constant Supervision/Assistance  Patient can return home with the following A little help with walking and/or transfers;A little help with bathing/dressing/bathroom;Assistance with cooking/housework;Help with stairs or ramp for entrance;Assist for transportation  PT equipment None recommended by PT  AM-PAC PT "6 Clicks" Mobility Outcome Measure (Version 2)  Help needed turning from your back to your side while in a flat bed without using bedrails? 3  Help needed moving from lying on your back to sitting on the side of a flat bed without using bedrails? 3  Help needed moving to and from a bed to a chair (including a wheelchair)? 3  Help needed standing up from a chair using your arms (e.g., wheelchair or bedside chair)? 3  Help needed to walk in hospital room? 3  Help needed climbing 3-5 steps with a railing?  1  6 Click Score 16  Consider  Recommendation of Discharge To: Home with [facility]  Progressive Mobility  What is the highest level of mobility based on the progressive mobility assessment? Level 5 (Walks with assist in room/hall) - Balance while stepping forward/back and can walk in room with assist - Complete  Activity Transferred from bed to chair (Per PT)  PT Goal Progression  Progress towards PT goals Progressing toward goals  Acute Rehab PT Goals  PT Goal Formulation With patient  Time For Goal Achievement 04/09/22  Potential to Achieve Goals Good  PT Time Calculation  PT Start Time (ACUTE ONLY) 0848  PT Stop Time (ACUTE ONLY) 1610  PT Time Calculation (min) (ACUTE ONLY) 34 min  PT General Charges  $$ ACUTE PT VISIT 1 Visit  PT Treatments  $Gait Training 8-22 mins  $Therapeutic Exercise 8-22 mins     Wynn Maudlin, DPT Acute Rehabilitation Services Office [phone] Pager [phone]  03/28/22 3:34 PM

## 2022-03-28 NOTE — TOC Transition Note (Signed)
Transition of Care [facility]) - CM/SW Discharge Note   Patient Details  Name: EMERSYNN DEATLEY MRN: [id number] Date of Birth: [birth year]  Transition of Care [facility]) CM/SW Contact:  Carley Hammed, LCSWA Phone Number: 03/28/2022, 9:57 AM   Clinical Narrative:    Pt to be transported to [facility] via [place]. Nurse to call report to [phone].   Final next level of care: Skilled Nursing Facility Barriers to Discharge: Barriers Resolved   Patient Goals and CMS Choice Patient states their goals for this hospitalization and ongoing recovery are:: Pt states she would like to go to [facility]. CMS Medicare.gov Compare Post Acute Care list provided to:: Patient Choice offered to / list presented to : Patient, Adult Children  Discharge Placement              Patient chooses bed at: [facility] Patient to be transferred to facility by: [facility] Name of family member notified: Olegario Messier Patient and family notified of of transfer: 03/28/22  Discharge Plan and Services     Post Acute Care Choice: Skilled Nursing Facility                               Social Determinants of Health (SDOH) Interventions     Readmission Risk Interventions     No data to display

## 2022-03-28 NOTE — Progress Notes (Signed)
Patient ID: [name], female   DOB: Sep 27, 1933, 88 y.o.   MRN: [id number] The patient is doing well overall.  Her right operative knee is stable.  She can be discharged to skilled nursing today.

## 2022-03-28 NOTE — Care Management Important Message (Signed)
Important Message  Patient Details  Name: [name] MRN: [id number] Date of Birth: 09-Nov-1933   Medicare Important Message Given:  Yes     Sherilyn Banker 03/28/2022, 12:59 PM

## 2022-03-28 NOTE — Progress Notes (Signed)
Report given to nurse at [facility]. Pt transported to facility with all belongings returned.

## 2022-03-28 NOTE — Plan of Care (Signed)
  Problem: Education: Goal: Knowledge of the prescribed therapeutic regimen will improve Outcome: Progressing   Problem: Activity: Goal: Range of joint motion will improve Outcome: Progressing   Problem: Pain Management: Goal: Pain level will decrease with appropriate interventions Outcome: Progressing   Problem: Education: Goal: Knowledge of General Education information will improve Description: Including pain rating scale, medication(s)/side effects and non-pharmacologic comfort measures Outcome: Progressing   Problem: Clinical Measurements: Goal: Ability to maintain clinical measurements within normal limits will improve Outcome: Progressing   Problem: Nutrition: Goal: Adequate nutrition will be maintained Outcome: Progressing

## 2022-03-28 NOTE — Progress Notes (Signed)
Called SNF but no answer. Will try again

## 2022-03-31 ENCOUNTER — Telehealth: Payer: Self-pay

## 2022-03-31 ENCOUNTER — Telehealth: Payer: Self-pay | Admitting: Orthopaedic Surgery

## 2022-03-31 NOTE — Telephone Encounter (Signed)
Called and advised.

## 2022-03-31 NOTE — Telephone Encounter (Signed)
Patient's daughter called. Says patient can not take amitriptyline. Would like Dr. Magnus Ivan to call [facility] in [place] to cxl that medication. Her call back number is [phone]

## 2022-03-31 NOTE — Telephone Encounter (Signed)
[name] with [facility].would like to know ROM allowance and if Aquacel dressing needs to stay on until F/U.  Cb# [phone].  Please advise.  Thank you.

## 2022-03-31 NOTE — Telephone Encounter (Signed)
Please advise 

## 2022-03-31 NOTE — Telephone Encounter (Signed)
Lvm for [place] to cb to cancel this. I called and advised daughter was working on this

## 2022-04-02 ENCOUNTER — Telehealth: Payer: Self-pay | Admitting: Orthopaedic Surgery

## 2022-04-02 NOTE — Telephone Encounter (Signed)
Patients daughter aware

## 2022-04-02 NOTE — Telephone Encounter (Signed)
Patient's daughter called. Would like someone to call her. Mom is wanting to take Tylenol arthritis. Would like to know if that would be ok?  [phone] is her number to call her back

## 2022-04-09 ENCOUNTER — Encounter: Payer: Self-pay | Admitting: Orthopaedic Surgery

## 2022-04-09 ENCOUNTER — Other Ambulatory Visit: Payer: Self-pay

## 2022-04-09 ENCOUNTER — Ambulatory Visit (INDEPENDENT_AMBULATORY_CARE_PROVIDER_SITE_OTHER): Payer: Medicare Other | Admitting: Orthopaedic Surgery

## 2022-04-09 DIAGNOSIS — Z96651 Presence of right artificial knee joint: Secondary | ICD-10-CM

## 2022-04-09 DIAGNOSIS — M5116 Intervertebral disc disorders with radiculopathy, lumbar region: Secondary | ICD-10-CM

## 2022-04-09 NOTE — Progress Notes (Signed)
HPI: [name] returns today status post right total knee arthroplasty 523.  She had a skilled facility.  She overall states she is progressing well with therapy.  She does come come in today in a wheelchair though.  She was on Eliquis postoperatively for 10 days after discharge.  She is on no chronic anticoagulation.  She is taking tramadol for pain.  She has no complaints outside of the fact that she is having sciatic-like symptoms that she has had in the past into the right calf area pain begins in the low back and right buttocks area.  No right groin pain.  She states this is similar pain she has had in the past and has had epidural steroid injections which helped.  She is asking if she could get an epidural steroid injection here in our office.  Prior MRI is on care everywhere through [place].   Physical exam: General: Well-developed well-nourished female no acute distress mood and affect appropriate.  Psych alert and oriented x3  Right knee full extension flexes to 85 degrees.  No gross instability valgus varus stressing.  Surgical incisions healing well no signs of infection.  Right calf supple nontender.  Dorsiflexion plantarflexion right ankle intact.  Lower extremities: Good range of motion of the right hip.  Minimal tenderness over the trochanteric region.  Straight leg raise is negative bilaterally.  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  She is able to get the incision wet.  She will continue to work with therapy on range of motion strengthening right lower extremity.  We will try setting her up for an epidural steroid injection with Dr. Ernestina Patches here for right radicular symptoms.  Questions were encouraged and answered at length.  See her back in 4 weeks sooner if there is any questions concerns.

## 2022-04-10 ENCOUNTER — Telehealth: Payer: Self-pay

## 2022-04-10 NOTE — Telephone Encounter (Signed)
Patient's daughter concerned over late delivery of [place] and shot is due on Monday.  We will allow the use of sample but daughter will wait to pick up on Friday in case shipment may be delivered. She will let us know if it is not needed.  Mother is in nursing home and dupixent is given there.

## 2022-04-10 NOTE — Telephone Encounter (Signed)
The daughter is calling to let us know that her mom has not received her dupixent at home yet and her mom was unable to receive her injection. Spoke with Marcie Bal and she will be calling the daughter.

## 2022-04-14 ENCOUNTER — Telehealth: Payer: Self-pay | Admitting: Orthopaedic Surgery

## 2022-04-14 NOTE — Telephone Encounter (Signed)
Patient daughter has a question about the knee brace because it is causing pain. The best number to reach the doctor [phone]

## 2022-04-14 NOTE — Telephone Encounter (Signed)
Called and advised. Daughter stated understanding

## 2022-04-18 ENCOUNTER — Telehealth: Payer: Self-pay | Admitting: Orthopaedic Surgery

## 2022-04-18 NOTE — Telephone Encounter (Signed)
Patient's daughter called. Says mom is having concerns about the injection. daughter would like someone to call her. [phone]

## 2022-04-18 NOTE — Telephone Encounter (Signed)
I talked to the daughter, she was asking about the ESI that was ordered. I read Dr. Kennon Portela note to her and she stated they have an appt with Dr. Francesco Runner on 10/11. She will discuss this pain with him and cb if she decides she still wants her mom to see Dr. Ernestina Patches.

## 2022-04-23 NOTE — Telephone Encounter (Signed)
[name] was taking over this phone message. Patient's daughter concerned over late   delivery of [place] and shot is due on Monday.  We will allow the use of sample but daughter will wait to pick up on Friday in case shipment may be delivered. She will let us know if it is not needed.  Mother is in nursing home and dupixent is given there.

## 2022-05-07 ENCOUNTER — Encounter: Payer: Medicare Other | Admitting: Orthopaedic Surgery

## 2022-05-12 ENCOUNTER — Encounter: Payer: Self-pay | Admitting: Orthopaedic Surgery

## 2022-05-12 ENCOUNTER — Ambulatory Visit (INDEPENDENT_AMBULATORY_CARE_PROVIDER_SITE_OTHER): Payer: Medicare Other | Admitting: Orthopaedic Surgery

## 2022-05-12 DIAGNOSIS — Z96651 Presence of right artificial knee joint: Secondary | ICD-10-CM

## 2022-05-12 NOTE — Progress Notes (Signed)
The patient is an active 88 year old female who is now 6 weeks status post revision of a failed polyethylene liner from a right knee replacement.  She had a actually a subluxation or dislocation of the polyliner.  She is ambulate with a cane.  She has a little bit of discomfort and swelling in his been having home therapy.  She said there can release her send a home exercise program.  She feels like she can continue to do exercises on her own.  Her daughter is with her today as well.  She did drive before the surgery and so I am fine with her getting back to driving as soon as she is comfortable doing so.  I did examine her right knee and it has excellent range of motion and is ligamentously stable.  I gave her reassurance that the knee feels very stable to me.  She has had no other symptoms of instability of that knee that she was having before when the polyliner dislocated.  I will see her back in 3 months to see how she is doing overall.  I would like an AP and lateral of her right knee at that visit.  If there are issues before 3 months they know to let us know.

## 2022-06-05 NOTE — Progress Notes (Signed)
FOLLOW UP Date of Service/Encounter:  06/06/22   Subjective:  [name] (DOB: 07-10-1934) is a 88 y.o. female who returns to the [facility] [place] on 06/06/2022 in re-evaluation of the following: atopic dermatitis, stasis dermatitis History obtained from: chart review and patient.  For Review, LV was on 11/21/21  with Dr.Velma Hanna seen for routine follow-up.  She was started on Dupixent in early 2023, and had noticed significant difference in her overall skin since starting this medication.  She continues to have some stasis dermatitis but had improvement with using compression stockings.  Still requiring hydroxyzine around 3 times per week to help with itching of lower legs.  Today presents for follow-up. [name] is doing well.  Her skin occasionally will have a few flares, but has significantly improved since starting Dupixent.  She does continue to occasionally have some dryness and redness on the bottom of her legs, but this typically will improve by using Sarna lotion or Lac-Hydrin.  Occasionally she will use topical steroids but not very often.  She is not having any adverse events from [place] use.  She does continue to follow with dermatology. She did have to have a total knee repair and has had some minor complications from that surgery, but otherwise is doing great.  Allergies as of 06/06/2022       Reactions   Azithromycin Other (See Comments)   Loss of hearing    Gabapentin Other (See Comments)   FALLS and makes pts body feels "weird" Patient Reports: Falls like Drop Attacks (no warning, just falls to the floor) Patient Reports: "It makes my body feel weird"   Amoxicillin Itching   Codeine Nausea And Vomiting   Headaches    Feldene [piroxicam] Other (See Comments)   Liver enzyme elevation   Pepcid [famotidine] Other (See Comments)   constipation   Warfarin Other (See Comments)   BRUISING   Atorvastatin Itching        Medication List        Accurate  as of June 06, 2022 12:57 PM. If you have any questions, ask your nurse or doctor.          acetaminophen 650 MG CR tablet Commonly known as: TYLENOL Take 1,300 mg by mouth 2 (two) times daily as needed for pain.   amLODipine 2.5 MG tablet Commonly known as: NORVASC Take 2.5 mg by mouth daily.   amLODipine 5 MG tablet Commonly known as: NORVASC Take 5 mg by mouth daily.   cetirizine 10 MG tablet Commonly known as: ZYRTEC Take 10 mg by mouth daily as needed for allergies.   clobetasol cream 0.05 % Commonly known as: TEMOVATE Apply 1 application topically 2 (two) times daily as needed. What changed:  when to take this reasons to take this   diclofenac Sodium 1 % Gel Commonly known as: VOLTAREN Apply 2 g topically 4 (four) times daily.   DULoxetine 60 MG capsule Commonly known as: CYMBALTA Take 60 mg by mouth daily.   dupilumab 300 MG/2ML prefilled syringe Commonly known as: DUPIXENT Inject 300 mg into the skin every 14 (fourteen) days. Every 14 days   ezetimibe 10 MG tablet Commonly known as: ZETIA Take 10 mg by mouth daily.   Ferrous Sulfate 28 MG Tabs Take 28 mg by mouth daily.   losartan 100 MG tablet Commonly known as: COZAAR Take 100 mg by mouth at bedtime.   metoprolol succinate 25 MG 24 hr tablet Commonly known as: TOPROL-XL Take 25  mg by mouth at bedtime.   omeprazole 20 MG capsule Commonly known as: PRILOSEC Take 20 mg by mouth daily.   Polyethyl Glycol-Propyl Glycol 0.4-0.3 % Soln Place 2 drops into both eyes daily. Systane   PRESERVISION AREDS 2+MULTI VIT PO Take 1 tablet by mouth in the morning and at bedtime.   Prolia 60 MG/ML Soln injection Generic drug: denosumab 60 mg. Injection every 6 months   Synthroid 88 MCG tablet Generic drug: levothyroxine Take 88 mcg by mouth every other day.   Synthroid 75 MCG tablet Generic drug: levothyroxine Take 75 mcg by mouth every other day.   traMADol 50 MG tablet Commonly known as:  ULTRAM Take 1-2 tablets (50-100 mg total) by mouth every 6 (six) hours as needed for moderate pain.       Past Medical History:  Diagnosis Date   Anxiety    Arthritis    Depression    GERD (gastroesophageal reflux disease)    Hearing loss of right ear    Asymmetrical   Hypertension    Hypothyroidism    Pre-diabetes    Pulmonary embolism (HCC)    Past Surgical History:  Procedure Laterality Date   APPENDECTOMY     CATARACT EXTRACTION     FOOT NEUROMA SURGERY Left 1982   FOOT NEUROMA SURGERY Right [phone]   FOOT SURGERY     IVC FILTER INSERTION Right 2011   KNEE ARTHROSCOPY Right 2011   REPLACEMENT TOTAL KNEE     ROTATOR CUFF REPAIR Left 2003   SQUAMOUS CELL CARCINOMA EXCISION     07/2020   TOTAL KNEE REVISION Right 03/25/2022   Procedure: RIGHT TOTAL KNEE POLY LINER EXCHANGE;  Surgeon: Kathryne Hitch, MD;  Location: [facility] OR;  Service: Orthopedics;  Laterality: Right;   Otherwise, there have been no changes to her past medical history, surgical history, family history, or social history.  ROS: All others negative except as noted per HPI.   Objective:  BP (!) 124/56   Pulse 74   Temp 99 F (37.2 C) (Temporal)   Resp 16   SpO2 97%  There is no height or weight on file to calculate BMI. Physical Exam: General Appearance:  Alert, cooperative, no distress, appears stated age  Head:  Normocephalic, without obvious abnormality, atraumatic  Eyes:  Conjunctiva clear, EOM's intact  Nose: Nares normal, no rhinorrhea  Throat: Lips, tongue normal; teeth and gums normal,  moist mucus membranes  Neck: Supple, symmetrical  Lungs:   clear to auscultation bilaterally, Respirations unlabored, no coughing  Heart:  regular rate and rhythm and no murmur, Appears well perfused  Extremities: No edema  Skin: Skin color, texture, turgor normal, erythematous dry skin on lower legs  Neurologic: No gross deficits    Assessment/Plan   Chronic itching with rash (eczema + prurigo  nodularis + stasis dermatitis?):  - use compression stockings on your legs to help with the rash on your legs - environmental allergy testing was negative on 06/27/21 - continue clobetasol 0.05% up to twice daily on body- do not use on face, groin or armpits - continue Dupixent for 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 May consider trial off dupixent at follow-up  It was a pleasure seeing you again in clinic today!  Tonny Bollman, MD  [facility]

## 2022-06-06 ENCOUNTER — Ambulatory Visit (INDEPENDENT_AMBULATORY_CARE_PROVIDER_SITE_OTHER): Payer: Medicare Other | Admitting: Internal Medicine

## 2022-06-06 ENCOUNTER — Encounter: Payer: Self-pay | Admitting: Internal Medicine

## 2022-06-06 VITALS — BP 124/56 | HR 74 | Temp 99.0°F | Resp 16

## 2022-06-06 DIAGNOSIS — I872 Venous insufficiency (chronic) (peripheral): Secondary | ICD-10-CM

## 2022-06-06 DIAGNOSIS — L281 Prurigo nodularis: Secondary | ICD-10-CM | POA: Diagnosis not present

## 2022-06-06 DIAGNOSIS — L209 Atopic dermatitis, unspecified: Secondary | ICD-10-CM | POA: Diagnosis not present

## 2022-06-06 NOTE — Patient Instructions (Addendum)
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 May consider trial off dupixent at follow-up  It was a pleasure seeing you again in clinic today!  Tonny Bollman, MD [facility]

## 2022-08-07 ENCOUNTER — Telehealth: Payer: Self-pay

## 2022-08-07 NOTE — Telephone Encounter (Signed)
Patient has been approved for another year of dupixent, patient is in [facility] for rehab because she fell 3 days before [place] and fractured her hip in 2 places and her arm. She will have to pay extra on Medicare A to cover the dupixent. Does she need to be on the dupixent? Please advise.

## 2022-08-07 NOTE — Telephone Encounter (Signed)
It did seem to be helping, but we had discussed a trial off this medication since her rash was not clearly atopic dermatitis.   We can certainly do a trial off, and if she needs it, we will restart.  This will be the only way to know if it is clearly helping.   It is okay to discontinue without spacing.

## 2022-08-07 NOTE — Telephone Encounter (Signed)
Dr. Sebastian Ache resonse verbally given to patient. Told patient to call office if her rash returns.

## 2022-08-13 ENCOUNTER — Ambulatory Visit (INDEPENDENT_AMBULATORY_CARE_PROVIDER_SITE_OTHER): Payer: Medicare Other | Admitting: Orthopaedic Surgery

## 2022-08-13 ENCOUNTER — Encounter: Payer: Self-pay | Admitting: Orthopaedic Surgery

## 2022-08-13 ENCOUNTER — Ambulatory Visit (INDEPENDENT_AMBULATORY_CARE_PROVIDER_SITE_OTHER): Payer: Medicare Other

## 2022-08-13 DIAGNOSIS — Z96651 Presence of right artificial knee joint: Secondary | ICD-10-CM | POA: Diagnosis not present

## 2022-08-13 NOTE — Progress Notes (Signed)
The patient is an 89 year old female who is 5 months out from a polyliner revision of an unstable right total knee arthroplasty.  She says her knee has done well but a few days before Christmas she had a hard mechanical fall and she fractured her left hip that required a partial hip arthroplasty and a left wrist fracture.  She is followed by the orthopedic surgeons in [facility].  Right now she is convalescing in the skilled nursing facility.  She also has some chronic back issues and sees a spine specialist in [place] and has had injections that have not helped.  I did see a previous MRI of her lumbar spine that showed severe spinal and foraminal stenosis at multiple levels.  She says she has had no instability symptoms like she had before with her right knee.  Examination only today of her right knee she has full range of motion of that right knee with no effusion.  There is no instability on exam.  2 views of the right knee show well-seated total knee arthroplasty with no complicating features.  From a knee standpoint, follow-up can be as needed.  She will maintain her follow-up with the orthopedic surgeons in [facility] as well as her spine surgeon in [place].  All questions and concerns were addressed and answered.

## 2022-09-25 ENCOUNTER — Encounter: Payer: Self-pay | Admitting: Radiology

## 2022-12-11 NOTE — Progress Notes (Signed)
FOLLOW UP Date of Service/Encounter:  12/12/22   Subjective:  [name] (DOB: Jul 01, 1934) is a 89 y.o. female who [facility] 12[facility]12/2022 in re-evaluation of the following: atopic dermatitis, stasis dermatitis  History obtained from: chart review and patient.  For Review, LV was on 06/06/22  with Dr.Calvin Jablonowski seen for routine follow-up. See below for summary of history and diagnostics.  Therapeutic plans/changes recommended: We did continue Dupixent as she did feel a noticeable difference since starting this medication.  Pertinent history/diagnostics: Rash:  She was started on Dupixent in early 2023, and had noticed significant difference in her overall skin since starting this medication. She continues to have some stasis dermatitis but had improvement with using compression stockings. Still requiring hydroxyzine around 3 times per week to help with itching of lower legs.  Follows with dermatology for skin checks. --environmental allergy testing was negative on 06/27/21   Interval history-patient called on 08/07/2022 stating that her Dupixent will no longer be completely covered by Medicare and that she recently fell on her hip. wondering if she could trial off the dupixent.  We discussed stopping the medication to see if her rash worsened or returned.  Today presents for follow-up. She fell and broke her hip and is having some shoulder issues.  Since that occurred she has been living in an assisted living facility.  Her Dupixent was stopped, and she reports that her skin has been doing well despite stopping this medication.  She will occasionally have some itching, but has been using a Neosporin for itch cream which takes the itch away.  She uses this very infrequently.  She reports that when her rash initially started she was having what looked like mosquito bites popping up everywhere and then this turned into only itching with maybe some mild redness.  This  eventually resolved while on Dupixent.  She does use Aveeno or CeraVe moisturizer on a regular basis.  She has a spot on her leg and 1 on her right neck that she reports as "getting scabby, healing, and then turning scabby again." The spots do not itch and are not painful.  She has not followed up with dermatology in some time.  She does have a history of skin cancer.   Allergies as of 12/12/2022       Reactions   Azithromycin Other (See Comments)   Loss of hearing    Gabapentin Other (See Comments)   FALLS and makes pts body feels "weird" Patient Reports: Falls like Drop Attacks (no warning, just falls to the floor) Patient Reports: "It makes my body feel weird"   Amoxicillin Itching   Codeine Nausea And Vomiting   Headaches    Feldene [piroxicam] Other (See Comments)   Liver enzyme elevation   Pepcid [famotidine] Other (See Comments)   constipation   Warfarin Other (See Comments)   BRUISING   Atorvastatin Itching        Medication List        Accurate as of Dec 12, 2022  1:22 PM. If you have any questions, ask your nurse or doctor.          acetaminophen 650 MG CR tablet Commonly known as: TYLENOL Take 1,300 mg by mouth 2 (two) times daily as needed for pain.   amitriptyline 25 MG tablet Commonly known as: ELAVIL Take 25 mg by mouth at bedtime.   amLODipine 2.5 MG tablet Commonly known as: NORVASC Take 2.5 mg by mouth daily.  amLODipine 5 MG tablet Commonly known as: NORVASC Take 5 mg by mouth daily.   aspirin EC 325 MG tablet Take by mouth.   cetirizine 10 MG tablet Commonly known as: ZYRTEC Take 10 mg by mouth daily as needed for allergies.   clobetasol cream 0.05 % Commonly known as: TEMOVATE Apply 1 application topically 2 (two) times daily as needed.   cyanocobalamin 1000 MCG tablet Commonly known as: VITAMIN B12 Take by mouth.   diclofenac Sodium 1 % Gel Commonly known as: VOLTAREN Apply 2 g topically 4 (four) times daily.   DULoxetine 60  MG capsule Commonly known as: CYMBALTA Take 60 mg by mouth daily.   dupilumab 300 MG/2ML prefilled syringe Commonly known as: DUPIXENT Inject 300 mg into the skin every 14 (fourteen) days. Every 14 days   Eliquis 5 MG Tabs tablet Generic drug: apixaban Take 5 mg by mouth 2 (two) times daily.   ezetimibe 10 MG tablet Commonly known as: ZETIA Take 10 mg by mouth daily.   Ferrous Sulfate 28 MG Tabs Take 28 mg by mouth daily.   losartan 100 MG tablet Commonly known as: COZAAR Take 100 mg by mouth at bedtime.   metoprolol succinate 25 MG 24 hr tablet Commonly known as: TOPROL-XL Take 25 mg by mouth at bedtime.   omeprazole 20 MG capsule Commonly known as: PRILOSEC Take 20 mg by mouth daily.   Polyethyl Glycol-Propyl Glycol 0.4-0.3 % Soln Place 2 drops into both eyes daily. Systane   PRESERVISION AREDS 2+MULTI VIT PO Take 1 tablet by mouth in the morning and at bedtime.   Prolia 60 MG/ML Soln injection Generic drug: denosumab 60 mg. Injection every 6 months   Synthroid 88 MCG tablet Generic drug: levothyroxine Take 88 mcg by mouth every other day.   Synthroid 75 MCG tablet Generic drug: levothyroxine Take 75 mcg by mouth every other day.   traMADol 50 MG tablet Commonly known as: ULTRAM Take 1-2 tablets (50-100 mg total) by mouth every 6 (six) hours as needed for moderate pain.       Past Medical History:  Diagnosis Date   Anxiety    Arthritis    Depression    GERD (gastroesophageal reflux disease)    Hearing loss of right ear    Asymmetrical   Hypertension    Hypothyroidism    Pre-diabetes    Pulmonary embolism (HCC)    Past Surgical History:  Procedure Laterality Date   APPENDECTOMY     CATARACT EXTRACTION     FOOT NEUROMA SURGERY Left 1982   FOOT NEUROMA SURGERY Right [phone]   FOOT SURGERY     IVC FILTER INSERTION Right 2011   KNEE ARTHROSCOPY Right 2011   REPLACEMENT TOTAL KNEE     ROTATOR CUFF REPAIR Left 2003   SQUAMOUS CELL CARCINOMA  EXCISION     07/2020   TOTAL KNEE REVISION Right 03/25/2022   Procedure: RIGHT TOTAL KNEE POLY LINER EXCHANGE;  Surgeon: Kathryne Hitch, MD;  Location: [facility] OR;  Service: Orthopedics;  Laterality: Right;   Otherwise, there have been no changes to her past medical history, surgical history, family history, or social history.  ROS: All others negative except as noted per HPI.   Objective:  BP 114/62   Pulse 93   Temp 98.4 F (36.9 C) (Temporal)   Resp 17   Wt 106 lb 1.6 oz (48.1 kg)   SpO2 97%   BMI 17.66 kg/m  Body mass index is 17.66 kg/m. Physical Exam:  General Appearance:  Alert, cooperative, no distress, appears stated age, thin  Head:  Normocephalic, without obvious abnormality, atraumatic  Eyes:  Conjunctiva clear, EOM's intact  Nose: Nares normal,   Throat: Lips, tongue normal; teeth and gums normal,   Neck: Supple, symmetrical  Lungs:   clear to auscultation bilaterally, Respirations unlabored, no coughing  Heart:  regular rate and rhythm and no murmur, Appears well perfused  Extremities: No edema  Skin: Erythematous plaque on left lower leg with some scabbing, quarter sized, area on right lower neck - nevus with some scabbing , around 0.24 cm or less  Neurologic: No gross deficits   Assessment/Plan   Chronic itching with rash-much improved; off dupixent - use compression stockings on your legs  - environmental allergy testing was negative on 06/27/21 - daily-vaseline, cerave, vanicream, aquaphor, aveeno are all great options - continue Sarna cream as needed for itching - continue lac-hydrin lotion as needed for legs - continue your follow-up with Dermatology, especially with your history of skin cancer  The spot on your left leg and right neck/shoulder area are concerning-please follow-up with dermatology  Follow-up in 12 months, sooner if needed.   It was a pleasure seeing you again in clinic today!  Tonny Bollman, MD  [facility]

## 2022-12-12 ENCOUNTER — Ambulatory Visit (INDEPENDENT_AMBULATORY_CARE_PROVIDER_SITE_OTHER): Payer: Medicare Other | Admitting: Internal Medicine

## 2022-12-12 ENCOUNTER — Encounter: Payer: Self-pay | Admitting: Internal Medicine

## 2022-12-12 VITALS — BP 114/62 | HR 93 | Temp 98.4°F | Resp 17 | Wt 106.1 lb

## 2022-12-12 DIAGNOSIS — R21 Rash and other nonspecific skin eruption: Secondary | ICD-10-CM

## 2022-12-12 NOTE — Patient Instructions (Addendum)
Chronic itching with rash-much improved; off dupixent - use compression stockings on your legs  - environmental allergy testing was negative on 06/27/21 - daily-vaseline, cerave, vanicream, aquaphor, aveeno are all great options - continue Sarna cream as needed for itching - continue lac-hydrin lotion as needed for legs - continue your follow-up with Dermatology, especially with your history of skin cancer  The spot on your left leg and right neck/shoulder area are concerning-please follow-up with dermatology  Follow-up in 12 months, sooner if needed.   It was a pleasure seeing you again in clinic today!  Tonny Bollman, MD [facility]

## 2023-06-03 ENCOUNTER — Telehealth: Payer: Self-pay

## 2023-06-03 NOTE — Telephone Encounter (Signed)
Let patient know that Dr. Maurine Minister agrees with not restarting the dupixent and just use the cerave cream and clobetasol for a good barrier for her skin.

## 2023-06-03 NOTE — Telephone Encounter (Signed)
Patient calling to let you know her skin is much better, had to stop her dupixent injection last year after she suffered a fractured pelvis and arm. Patient is presently in a nursing home. Patient says she uses the cerave cream heavily on her skin when her skin is irritated. She also applies clobetasol. Patient is stating bc of her age she is leaning more on not wanting to restart the dupixent injections, she said if she was 34 or 89 years of age she would restart the dupixent. Please advise?

## 2024-07-19 ENCOUNTER — Ambulatory Visit: Admitting: Physician Assistant
